# Patient Record
Sex: Female | Born: 1960 | Race: White | Hispanic: No | Smoking: Never smoker
Health system: Southern US, Community
[De-identification: ages and names within clinical notes are randomized; demographics above are authoritative.]

## PROBLEM LIST (undated history)

## (undated) DIAGNOSIS — F32A Depression, unspecified: Secondary | ICD-10-CM

## (undated) DIAGNOSIS — I10 Essential (primary) hypertension: Secondary | ICD-10-CM

## (undated) DIAGNOSIS — F419 Anxiety disorder, unspecified: Secondary | ICD-10-CM

## (undated) DIAGNOSIS — E119 Type 2 diabetes mellitus without complications: Secondary | ICD-10-CM

## (undated) DIAGNOSIS — K589 Irritable bowel syndrome without diarrhea: Secondary | ICD-10-CM

## (undated) DIAGNOSIS — F329 Major depressive disorder, single episode, unspecified: Secondary | ICD-10-CM

## (undated) DIAGNOSIS — E785 Hyperlipidemia, unspecified: Secondary | ICD-10-CM

## (undated) HISTORY — DX: Type 2 diabetes mellitus without complications: E11.9

## (undated) HISTORY — DX: Hyperlipidemia, unspecified: E78.5

## (undated) HISTORY — DX: Essential (primary) hypertension: I10

## (undated) HISTORY — DX: Depression, unspecified: F32.A

## (undated) HISTORY — DX: Anxiety disorder, unspecified: F41.9

## (undated) HISTORY — DX: Major depressive disorder, single episode, unspecified: F32.9

---

## 2000-12-31 ENCOUNTER — Other Ambulatory Visit: Admission: RE | Admit: 2000-12-31 | Discharge: 2000-12-31 | Payer: Self-pay | Admitting: Obstetrics and Gynecology

## 2006-06-03 ENCOUNTER — Emergency Department (HOSPITAL_COMMUNITY): Admission: EM | Admit: 2006-06-03 | Discharge: 2006-06-04 | Payer: Self-pay | Admitting: Emergency Medicine

## 2009-07-08 HISTORY — PX: TUBAL LIGATION: SHX77

## 2009-07-08 HISTORY — PX: ENDOMETRIAL ABLATION: SHX621

## 2010-03-30 ENCOUNTER — Emergency Department (HOSPITAL_COMMUNITY): Admission: EM | Admit: 2010-03-30 | Discharge: 2010-03-30 | Payer: Self-pay | Admitting: Emergency Medicine

## 2010-04-27 ENCOUNTER — Ambulatory Visit (HOSPITAL_COMMUNITY): Admission: RE | Admit: 2010-04-27 | Discharge: 2010-04-27 | Payer: Self-pay | Admitting: Obstetrics and Gynecology

## 2010-05-01 ENCOUNTER — Ambulatory Visit (HOSPITAL_COMMUNITY): Admission: RE | Admit: 2010-05-01 | Discharge: 2010-05-01 | Payer: Self-pay | Admitting: Obstetrics and Gynecology

## 2010-05-07 ENCOUNTER — Ambulatory Visit (HOSPITAL_COMMUNITY): Admission: RE | Admit: 2010-05-07 | Discharge: 2010-05-07 | Payer: Self-pay | Admitting: Obstetrics and Gynecology

## 2010-09-19 LAB — CBC
HCT: 33.2 % — ABNORMAL LOW (ref 36.0–46.0)
Hemoglobin: 11.4 g/dL — ABNORMAL LOW (ref 12.0–15.0)
MCH: 30.2 pg (ref 26.0–34.0)
MCHC: 34.3 g/dL (ref 30.0–36.0)
MCV: 88.2 fL (ref 78.0–100.0)
Platelets: 249 10*3/uL (ref 150–400)
RBC: 3.76 MIL/uL — ABNORMAL LOW (ref 3.87–5.11)
RDW: 13 % (ref 11.5–15.5)
WBC: 8 10*3/uL (ref 4.0–10.5)

## 2010-09-19 LAB — BASIC METABOLIC PANEL
BUN: 11 mg/dL (ref 6–23)
CO2: 24 mEq/L (ref 19–32)
Calcium: 8.7 mg/dL (ref 8.4–10.5)
Chloride: 103 mEq/L (ref 96–112)
Creatinine, Ser: 0.77 mg/dL (ref 0.4–1.2)
GFR calc Af Amer: >60 mL/min (ref >60)
GFR calc non Af Amer: >60 mL/min (ref >60)
Glucose, Bld: 83 mg/dL (ref 70–99)
Potassium: 3.8 mEq/L (ref 3.5–5.1)
Sodium: 135 mEq/L (ref 135–145)

## 2010-09-19 LAB — SURGICAL PCR SCREEN
MRSA, PCR: NEGATIVE
Staphylococcus aureus: POSITIVE — AB

## 2010-09-19 LAB — HCG, QUANTITATIVE, PREGNANCY: hCG, Beta Chain, Quant, S: <2 m[IU]/mL (ref <5)

## 2010-09-20 LAB — POCT CARDIAC MARKERS
CKMB, poc: 1.1 ng/mL (ref 1.0–8.0)
CKMB, poc: 1.5 ng/mL (ref 1.0–8.0)
Myoglobin, poc: 113 ng/mL (ref 12–200)
Myoglobin, poc: 88.2 ng/mL (ref 12–200)
Troponin i, poc: <0.05 ng/mL (ref 0.00–0.09)
Troponin i, poc: <0.05 ng/mL (ref 0.00–0.09)

## 2010-09-20 LAB — CBC
HCT: 33.4 % — ABNORMAL LOW (ref 36.0–46.0)
Hemoglobin: 11.6 g/dL — ABNORMAL LOW (ref 12.0–15.0)
MCH: 30.7 pg (ref 26.0–34.0)
MCHC: 34.7 g/dL (ref 30.0–36.0)
MCV: 88.4 fL (ref 78.0–100.0)
Platelets: 203 10*3/uL (ref 150–400)
RBC: 3.78 MIL/uL — ABNORMAL LOW (ref 3.87–5.11)
RDW: 12.5 % (ref 11.5–15.5)
WBC: 7.8 10*3/uL (ref 4.0–10.5)

## 2010-09-20 LAB — DIFFERENTIAL
Basophils Absolute: 0.1 10*3/uL (ref 0.0–0.1)
Basophils Relative: 1 % (ref 0–1)
Eosinophils Absolute: 0.1 10*3/uL (ref 0.0–0.7)
Eosinophils Relative: 1 % (ref 0–5)
Lymphocytes Relative: 21 % (ref 12–46)
Lymphs Abs: 1.6 10*3/uL (ref 0.7–4.0)
Monocytes Absolute: 0.4 10*3/uL (ref 0.1–1.0)
Monocytes Relative: 5 % (ref 3–12)
Neutro Abs: 5.6 10*3/uL (ref 1.7–7.7)
Neutrophils Relative %: 72 % (ref 43–77)

## 2010-09-20 LAB — BASIC METABOLIC PANEL
BUN: 11 mg/dL (ref 6–23)
CO2: 24 mEq/L (ref 19–32)
Calcium: 8.9 mg/dL (ref 8.4–10.5)
Chloride: 103 mEq/L (ref 96–112)
Creatinine, Ser: 0.84 mg/dL (ref 0.4–1.2)
GFR calc Af Amer: >60 mL/min (ref >60)
GFR calc non Af Amer: >60 mL/min (ref >60)
Glucose, Bld: 97 mg/dL (ref 70–99)
Potassium: 3 mEq/L — ABNORMAL LOW (ref 3.5–5.1)
Sodium: 136 mEq/L (ref 135–145)

## 2010-09-20 LAB — D-DIMER, QUANTITATIVE: D-Dimer, Quant: 0.38 ug/mL-FEU (ref 0.00–0.48)

## 2011-06-07 ENCOUNTER — Other Ambulatory Visit: Payer: Self-pay | Admitting: Obstetrics and Gynecology

## 2011-06-07 DIAGNOSIS — Z139 Encounter for screening, unspecified: Secondary | ICD-10-CM

## 2011-10-09 ENCOUNTER — Other Ambulatory Visit: Payer: Self-pay | Admitting: Obstetrics and Gynecology

## 2011-10-09 DIAGNOSIS — Z139 Encounter for screening, unspecified: Secondary | ICD-10-CM

## 2011-10-14 ENCOUNTER — Ambulatory Visit (HOSPITAL_COMMUNITY)
Admission: RE | Admit: 2011-10-14 | Discharge: 2011-10-14 | Disposition: A | Discharge status: Home / Self Care | Payer: 59 | Source: Ambulatory Visit | Attending: Obstetrics and Gynecology | Admitting: Obstetrics and Gynecology

## 2011-10-14 DIAGNOSIS — Z1231 Encounter for screening mammogram for malignant neoplasm of breast: Secondary | ICD-10-CM | POA: Insufficient documentation

## 2011-10-14 DIAGNOSIS — Z139 Encounter for screening, unspecified: Secondary | ICD-10-CM

## 2012-10-19 ENCOUNTER — Other Ambulatory Visit: Payer: Self-pay | Admitting: Obstetrics and Gynecology

## 2012-10-19 DIAGNOSIS — Z139 Encounter for screening, unspecified: Secondary | ICD-10-CM

## 2012-10-26 ENCOUNTER — Ambulatory Visit (HOSPITAL_COMMUNITY)
Admission: RE | Admit: 2012-10-26 | Discharge: 2012-10-26 | Disposition: A | Discharge status: Home / Self Care | Payer: 59 | Source: Ambulatory Visit | Attending: Obstetrics and Gynecology | Admitting: Obstetrics and Gynecology

## 2012-10-26 DIAGNOSIS — Z139 Encounter for screening, unspecified: Secondary | ICD-10-CM

## 2012-10-26 DIAGNOSIS — Z1231 Encounter for screening mammogram for malignant neoplasm of breast: Secondary | ICD-10-CM | POA: Insufficient documentation

## 2013-01-11 ENCOUNTER — Ambulatory Visit (INDEPENDENT_AMBULATORY_CARE_PROVIDER_SITE_OTHER): Payer: 59 | Admitting: Gastroenterology

## 2013-01-11 ENCOUNTER — Encounter: Payer: Self-pay | Admitting: Gastroenterology

## 2013-01-11 VITALS — BP 125/77 | HR 63 | Temp 97.4°F | Ht 61.0 in | Wt 161.8 lb

## 2013-01-11 DIAGNOSIS — Z1211 Encounter for screening for malignant neoplasm of colon: Secondary | ICD-10-CM

## 2013-01-11 MED ORDER — PEG 3350-KCL-NA BICARB-NACL 420 G PO SOLR: 4000.0000 mL | ORAL | Status: DC

## 2013-01-11 NOTE — Progress Notes (Signed)
Referring Provider: No ref. provider found Primary Care Physician:  Alleen Borne Primary Gastroenterologist:  Dr. Darrick Penna   Chief Complaint  Patient presents with  . Colonoscopy    HPI:   52 year old female presents today for initial screening colonoscopy. Notes chronic history of loose stools intermittently. Some days without any bowel movements, others with loose stools 2-3 times per day. Usually postprandial. Occasional abdominal cramping. No rectal bleeding. Weight is stable. [employer], hot dogs exacerbate symptoms. Will take Imodium to help. Rare reflux.   Past Medical History  Diagnosis Date  . Hypertension   . Hypercholesterolemia   . Anxiety     Past Surgical History  Procedure Laterality Date  . Partial hysterectomy    . Tubal ligation      Current Outpatient Prescriptions  Medication Sig Dispense Refill  . aspirin 81 MG tablet Take 81 mg by mouth daily.      . cetirizine (ZYRTEC) 10 MG tablet Take 10 mg by mouth daily.      Marland Kitchen PARoxetine (PAXIL) 20 MG tablet Take 20 mg by mouth every morning.      . pravastatin (PRAVACHOL) 20 MG tablet Take 20 mg by mouth daily.      Marland Kitchen triamterene-hydrochlorothiazide (DYAZIDE) 37.5-25 MG per capsule Take 1 capsule by mouth every morning.      . polyethylene glycol-electrolytes (TRILYTE) 420 G solution Take 4,000 mLs by mouth as directed.  4000 mL  0   No current facility-administered medications for this visit.    Allergies as of 01/11/2013 - Review Complete 01/11/2013  Allergen Reaction Noted  . Prednisone Swelling 01/11/2013    Family History  Problem Relation Age of Onset  . Colon cancer Neg Hx     History   Social History  . Marital Status: Divorced    Spouse Name: N/A    Number of Children: N/A  . Years of Education: N/A   Occupational History  . Employed     EDI    Social History Main Topics  . Smoking status: Never Smoker   . Smokeless tobacco: Not on file  . Alcohol Use: Yes     Comment:  socially  . Drug Use: No  . Sexually Active: Not on file   Other Topics Concern  . Not on file   Social History Narrative  . No narrative on file    Review of Systems: Gen: see HPI CV: Denies chest pain, heart palpitations, syncope, peripheral edema. Resp: +DOE GI: see HPI GU : Denies urinary burning, urinary frequency, urinary incontinence.  MS: age-related joint pain Derm: Denies rash, itching, dry skin Psych: +anxiety Heme: Denies bruising, bleeding, and enlarged lymph nodes.  Physical Exam: BP 125/77  Pulse 63  Temp(Src) 97.4 F (36.3 C) (Oral)  Ht 5\' 1"  (1.549 m)  Wt 161 lb 12.8 oz (73.392 kg)  BMI 30.59 kg/m2 General:   Alert and oriented. Well-developed, well-nourished, pleasant and cooperative. Head:  Normocephalic and atraumatic. Eyes:  Conjunctiva pink, sclera clear, no icterus.   Conjunctiva pink. Ears:  Normal auditory acuity. Nose:  No deformity, discharge,  or lesions. Mouth:  No deformity or lesions, mucosa pink and moist.  Neck:  Supple, without mass or thyromegaly. Lungs:  Clear to auscultation bilaterally, without wheezing, rales, or rhonchi.  Heart:  S1, S2 present without murmurs noted.  Abdomen:  +BS, soft, non-tender and non-distended. Without mass or HSM. No rebound or guarding. No hernias noted. Rectal:  Deferred  Msk:  Symmetrical without gross deformities. Normal posture.  Extremities:  Without clubbing or edema. Neurologic:  Alert and  oriented x4;  grossly normal neurologically. Skin:  Intact, warm and dry without significant lesions or rashes Cervical Nodes:  No significant cervical adenopathy. Psych:  Alert and cooperative. Normal mood and affect.

## 2013-01-11 NOTE — Patient Instructions (Addendum)
Please review the handout on IBS.  Start taking supplemental fiber daily. Examples include Benefiber or Metamucil.  Avoid fatty, greasy foods. Try to limit dairy products.   Start taking a probiotic each day. Examples include Digestive Advantage, Phillip's Colon Health, [employer] generic brand, Align, Restora.  We have scheduled a colonoscopy in the near future with Dr. Darrick Penna.   Diet and Irritable Bowel Syndrome  No cure has been found for irritable bowel syndrome (IBS). Many options are available to treat the symptoms. Your caregiver will give you the best treatments available for your symptoms. He or she will also encourage you to manage stress and to make changes to your diet. You need to work with your caregiver and Registered Dietician to find the best combination of medicine, diet, counseling, and support to control your symptoms. The following are some diet suggestions. FOODS THAT MAKE IBS WORSE  Fatty foods, such as [place] fries.  Milk products, such as cheese or ice cream.  Chocolate.  Alcohol.  Caffeine (found in coffee and some sodas).  Carbonated drinks, such as soda. If certain foods cause symptoms, you should eat less of them or stop eating them. FOOD JOURNAL   Keep a journal of the foods that seem to cause distress. Write down:  What you are eating during the day and when.  What problems you are having after eating.  When the symptoms occur in relation to your meals.  What foods always make you feel badly.  Take your notes with you to your caregiver to see if you should stop eating certain foods. FOODS THAT MAKE IBS BETTER Fiber reduces IBS symptoms, especially constipation, because it makes stools soft, bulky, and easier to pass. Fiber is found in bran, bread, cereal, beans, fruit, and vegetables. Examples of foods with fiber include:  Apples.  Peaches.  Pears.  Berries.  Figs.  Broccoli, raw.  Cabbage.  Carrots.  Raw peas.  Kidney  beans.  Lima beans.  Whole-grain bread.  Whole-grain cereal. Add foods with fiber to your diet a little at a time. This will let your body get used to them. Too much fiber at once might cause gas and swelling of your abdomen. This can trigger symptoms in a person with IBS. Caregivers usually recommend a diet with enough fiber to produce soft, painless bowel movements. High fiber diets may cause gas and bloating. However, these symptoms often go away within a few weeks, as your body adjusts. In many cases, dietary fiber may lessen IBS symptoms, particularly constipation. However, it may not help pain or diarrhea. High fiber diets keep the colon mildly enlarged (distended) with the added fiber. This may help prevent spasms in the colon. Some forms of fiber also keep water in the stool, thereby preventing hard stools that are difficult to pass.  Besides telling you to eat more foods with fiber, your caregiver may also tell you to get more fiber by taking a fiber pill or drinking water mixed with a special high fiber powder. An example of this is a natural fiber laxative containing psyllium seed.  TIPS  Large meals can cause cramping and diarrhea in people with IBS. If this happens to you, try eating 4 or 5 small meals a day, or try eating less at each of your usual 3 meals. It may also help if your meals are low in fat and high in carbohydrates. Examples of carbohydrates are pasta, rice, whole-grain breads and cereals, fruits, and vegetables.  If dairy products cause your  symptoms to flare up, you can try eating less of those foods. You might be able to handle yogurt better than other dairy products, because it contains bacteria that helps with digestion. Dairy products are an important source of calcium and other nutrients. If you need to avoid dairy products, be sure to talk with a Registered Dietitian about getting these nutrients through other food sources.  Drink enough water and fluids to keep  your urine clear or pale yellow. This is important, especially if you have diarrhea. FOR MORE INFORMATION  International Foundation for Functional Gastrointestinal Disorders: www.iffgd.org  National Digestive Diseases Information Clearinghouse: digestive.StageSync.si Document Released: 09/14/2003 Document Revised: 09/16/2011 Document Reviewed: 06/01/2007 [facility] Patient Information 2014 [place], [place].

## 2013-01-13 DIAGNOSIS — Z1211 Encounter for screening for malignant neoplasm of colon: Secondary | ICD-10-CM | POA: Insufficient documentation

## 2013-01-13 NOTE — Progress Notes (Signed)
CC PCP 

## 2013-01-13 NOTE — Assessment & Plan Note (Signed)
52 year old female with need for initial screening colonoscopy, presenting with IBS symptoms, mixed pattern. No rectal bleeding noted. She does report exacerbation of symptoms with certain dietary intake, and I have discussed avoidance of these foods. For now, implement supplemental fiber, add a probiotic, and avoid trigger foods. Proceed with lower GI evaluation in the near future.  Proceed with colonoscopy with Dr. Darrick Penna in the near future. The risks, benefits, and alternatives have been discussed in detail with the patient. They state understanding and desire to proceed.

## 2013-01-18 ENCOUNTER — Encounter (HOSPITAL_COMMUNITY): Payer: Self-pay | Admitting: *Deleted

## 2013-01-18 ENCOUNTER — Encounter (HOSPITAL_COMMUNITY)
Admission: RE | Disposition: A | Discharge status: Home / Self Care | Payer: Self-pay | Source: Ambulatory Visit | Attending: Gastroenterology

## 2013-01-18 ENCOUNTER — Ambulatory Visit (HOSPITAL_COMMUNITY)
Admission: RE | Admit: 2013-01-18 | Discharge: 2013-01-18 | Disposition: A | Discharge status: Home / Self Care | Payer: 59 | Source: Ambulatory Visit | Attending: Gastroenterology | Admitting: Gastroenterology

## 2013-01-18 DIAGNOSIS — Z1211 Encounter for screening for malignant neoplasm of colon: Secondary | ICD-10-CM

## 2013-01-18 DIAGNOSIS — K648 Other hemorrhoids: Secondary | ICD-10-CM | POA: Insufficient documentation

## 2013-01-18 DIAGNOSIS — R197 Diarrhea, unspecified: Secondary | ICD-10-CM

## 2013-01-18 DIAGNOSIS — I10 Essential (primary) hypertension: Secondary | ICD-10-CM | POA: Insufficient documentation

## 2013-01-18 HISTORY — PX: COLONOSCOPY: SHX5424

## 2013-01-18 HISTORY — DX: Irritable bowel syndrome, unspecified: K58.9

## 2013-01-18 SURGERY — COLONOSCOPY
Anesthesia: Moderate Sedation

## 2013-01-18 MED ORDER — MIDAZOLAM HCL 5 MG/5ML IJ SOLN
INTRAMUSCULAR | Status: DC | PRN
  Administered 2013-01-18: 1 mg via INTRAVENOUS
  Administered 2013-01-18 (×2): 2 mg via INTRAVENOUS

## 2013-01-18 MED ORDER — MIDAZOLAM HCL 5 MG/5ML IJ SOLN
INTRAMUSCULAR | Status: AC
  Filled 2013-01-18: qty 10

## 2013-01-18 MED ORDER — MEPERIDINE HCL 100 MG/ML IJ SOLN
INTRAMUSCULAR | Status: AC
  Filled 2013-01-18: qty 2

## 2013-01-18 MED ORDER — MEPERIDINE HCL 100 MG/ML IJ SOLN
INTRAMUSCULAR | Status: DC | PRN
  Administered 2013-01-18: 25 mg via INTRAVENOUS
  Administered 2013-01-18: 50 mg via INTRAVENOUS

## 2013-01-18 MED ORDER — STERILE WATER FOR IRRIGATION IR SOLN
Status: DC | PRN
  Administered 2013-01-18: 13:00:00

## 2013-01-18 MED ORDER — SODIUM CHLORIDE 0.9 % IV SOLN
INTRAVENOUS | Status: DC
  Administered 2013-01-18: 13:00:00 via INTRAVENOUS

## 2013-01-18 NOTE — Op Note (Addendum)
[facility] [address] [place], [address]   COLONOSCOPY PROCEDURE REPORT  PATIENT: [name] [name]  MR#: [id number] BIRTHDATE: 17-Dec-1960 , 51  yrs. old GENDER: Female ENDOSCOPIST: [name] Eva, MD REFERRED XB:JYNW Claggett, PA-C PROCEDURE DATE:  01/18/2013 PROCEDURE:   Colonoscopy with biopsy INDICATIONS:Colorectal cancer screening.  DIARRHEA 2-3X/WEEK FOR THE PAST 20 YEARS. NO RECTAL BLEEDING. MEDICATIONS: Demerol 75 mg IV and Versed 5 mg IV  DESCRIPTION OF PROCEDURE:    Physical exam was performed.  Informed consent was obtained from the patient after explaining the benefits, risks, and alternatives to procedure.  The patient was connected to monitor and placed in left lateral position. Continuous oxygen was provided by nasal cannula and IV medicine administered through an indwelling cannula.  After administration of sedation and rectal exam, the patients rectum was intubated and the EC-3890Li ([id number])  colonoscope was advanced under direct visualization to the ileum.  The scope was removed slowly by carefully examining the color, texture, anatomy, and integrity mucosa on the way out.  The patient was recovered in endoscopy and discharged home in satisfactory condition.    COLON FINDINGS: The mucosa appeared normal in the terminal ileum.  , A normal appearing cecum, ileocecal valve, and appendiceal orifice were identified.  The ascending, hepatic flexure, transverse, splenic flexure, descending, sigmoid colon and rectum appeared unremarkable. RANDOM BIOPSIES OBTAINED TO EVALUIATE FOR MICROSCOPIC COLITIS. No polyps or cancers were seen.  , and Small internal hemorrhoids were found.  PREP QUALITY: good.   CECAL W/D TIME: 14 minutes  COMPLICATIONS: None  ENDOSCOPIC IMPRESSION: 1.   Normal mucosa in the terminal ileum 2.   Normal colon 3.  Small internal hemorrhoids 4.   INTERMITTENT DIARRHEA MOST LIKELY DUE TO IBS-MIXED PATTERN, LESS LIKELY CELIAC  SPRUE OR MICROSCOPIC COLITIS  RECOMMENDATIONS: AWAIT BIOPSY HIGH FIBER/LOW FAT DIET CONTINUE PROBIOTIC DAILY IMODIUM AS NEEDED FOR DIARRHEA OPV in 4 mos. CONSIDER CELIAC SEROLOGIES AND/OR HBT FOR SIBO. TCS IN 10 YEARS       _______________________________ eSignedJonette Eva, MD 01/18/2013 2:35 PM Revised: 01/18/2013 2:35 PM    PATIENT NAME:  [name], [name] MR#: [id number]

## 2013-01-18 NOTE — H&P (Signed)
  Primary Care Physician:  Alleen Borne Primary Gastroenterologist:  Dr. Darrick Penna  Pre-Procedure History & Physical: HPI:  [name] [name] is a 52 y.o. female here for COLON CANCER SCREENING.  Past Medical History  Diagnosis Date  . Hypertension   . Hypercholesterolemia   . Anxiety   . IBS (irritable bowel syndrome)     Past Surgical History  Procedure Laterality Date  . Partial hysterectomy  2011    [facility]  . Tubal ligation  2011    [name]    Prior to Admission medications   Medication Sig Start Date End Date Taking? Authorizing Provider  aspirin 81 MG tablet Take 81 mg by mouth daily.   Yes Historical Provider, MD  cetirizine (ZYRTEC) 10 MG tablet Take 10 mg by mouth daily.   Yes Historical Provider, MD  PARoxetine (PAXIL) 20 MG tablet Take 20 mg by mouth every morning.   Yes Historical Provider, MD  polyethylene glycol-electrolytes (TRILYTE) 420 G solution Take 4,000 mLs by mouth as directed. 01/11/13  Yes West Bali, MD  pravastatin (PRAVACHOL) 20 MG tablet Take 20 mg by mouth daily.   Yes Historical Provider, MD  Probiotic Product (RESTORA PO) Take 1 tablet by mouth daily.   Yes Historical Provider, MD  triamterene-hydrochlorothiazide (DYAZIDE) 37.5-25 MG per capsule Take 1 capsule by mouth every morning.   Yes Historical Provider, MD    Allergies as of 01/11/2013 - Review Complete 01/11/2013  Allergen Reaction Noted  . Prednisone Swelling 01/11/2013    Family History  Problem Relation Age of Onset  . Colon cancer Neg Hx     History   Social History  . Marital Status: Divorced    Spouse Name: N/A    Number of Children: N/A  . Years of Education: N/A   Occupational History  . Employed     EDI    Social History Main Topics  . Smoking status: Never Smoker   . Smokeless tobacco: Not on file  . Alcohol Use: Yes     Comment: socially  . Drug Use: No  . Sexually Active: Not on file   Other Topics Concern  . Not on file   Social History  Narrative  . No narrative on file    Review of Systems: See HPI, otherwise negative ROS   Physical Exam: BP 137/84  Temp(Src) 97.7 F (36.5 C) (Oral)  Resp 22  Ht 5\' 1"  (1.549 m)  Wt 161 lb (73.029 kg)  BMI 30.44 kg/m2  SpO2 95% General:   Alert,  pleasant and cooperative in NAD Head:  Normocephalic and atraumatic. Neck:  Supple; Lungs:  Clear throughout to auscultation.    Heart:  Regular rate and rhythm. Abdomen:  Soft, nontender and nondistended. Normal bowel sounds, without guarding, and without rebound.   Neurologic:  Alert and  oriented x4;  grossly normal neurologically.  Impression/Plan:    SCREENING  Plan:  1. TCS TODAY

## 2013-01-20 ENCOUNTER — Telehealth: Payer: Self-pay | Admitting: Gastroenterology

## 2013-01-20 ENCOUNTER — Encounter: Payer: Self-pay | Admitting: Gastroenterology

## 2013-01-20 DIAGNOSIS — K589 Irritable bowel syndrome without diarrhea: Secondary | ICD-10-CM | POA: Insufficient documentation

## 2013-01-20 DIAGNOSIS — R197 Diarrhea, unspecified: Secondary | ICD-10-CM

## 2013-01-20 NOTE — Assessment & Plan Note (Signed)
NEEDS TTG IgA. CONSIDER HBT FOR SIBO.

## 2013-01-20 NOTE — Telephone Encounter (Addendum)
Please call pt. Her colon biopsies are normal. HER DIARRHEA IS MOST LIKELY DUE TO IBS-D. SHE SHOULD BE CHECKED FOR CELIAC SPRUE.    Use Imodium as needed for diarrhea.  FOLLOW A HIGH FIBER/low fat DIET. AVOID ITEMS THAT CAUSE BLOATING.   TAKE A PROBIOTIC DAILY FOR ONE MONTH ([employer] BRAND, PHILLIP'S COLON HEALTH, OR RESTORA). STOP IT AFTER 1 MONTH IF YOU FEEL IT'S NOT MAKING A DIFFERENCE.  FOLLOW UP IN 4 MOS E30 SLF.    Next colonoscopy in 10 years.

## 2013-01-21 ENCOUNTER — Encounter (HOSPITAL_COMMUNITY): Payer: Self-pay | Admitting: Gastroenterology

## 2013-01-21 NOTE — Telephone Encounter (Signed)
CC PCP 

## 2013-01-21 NOTE — Telephone Encounter (Signed)
Lab order was faxed to [facility] and pt said she will go today or tomorrow.

## 2013-01-21 NOTE — Telephone Encounter (Signed)
Pt returned call and was informed of her results.  

## 2013-01-21 NOTE — Telephone Encounter (Signed)
REMINDER APPTS MADE FOR TCS/OV

## 2013-01-21 NOTE — Telephone Encounter (Signed)
LM for pt to call

## 2013-01-26 NOTE — Telephone Encounter (Signed)
Cc PCP 

## 2013-01-26 NOTE — Telephone Encounter (Addendum)
PLEASE CALL PT. HER BLOOD TEST FOR CELIAC SPRUE IS NEGATIVE.    Use Imodium as needed for diarrhea. FOLLOW A HIGH FIBER/low fat DIET. AVOID ITEMS THAT CAUSE BLOATING.  TAKE A PROBIOTIC DAILY.   FOLLOW UP IN 4 MOS E30 SLF.  IF HER DIARRHEA IS NOT IMRPOVED AFTER A CHANGE IN DIET AND ADDING A PROBIOTIC, SHE WILL NEED A HBT FOR SIBO.

## 2013-01-27 ENCOUNTER — Telehealth: Payer: Self-pay | Admitting: Gastroenterology

## 2013-01-27 NOTE — Telephone Encounter (Signed)
Called and informed pt.  

## 2013-01-27 NOTE — Telephone Encounter (Signed)
LM for pt to call

## 2013-01-27 NOTE — Telephone Encounter (Signed)
Pt was returning call to nurse, but nurse was not available at the moment. Patient asked for a return call to her work number. [phone]  (regarding labs)

## 2013-01-27 NOTE — Telephone Encounter (Signed)
See results note. Pt is aware.  

## 2013-05-19 ENCOUNTER — Encounter: Payer: Self-pay | Admitting: Gastroenterology

## 2013-05-21 ENCOUNTER — Emergency Department (HOSPITAL_COMMUNITY)
Admission: EM | Admit: 2013-05-21 | Discharge: 2013-05-21 | Disposition: A | Discharge status: Home / Self Care | Payer: 59 | Attending: Emergency Medicine | Admitting: Emergency Medicine

## 2013-05-21 ENCOUNTER — Encounter (HOSPITAL_COMMUNITY): Payer: Self-pay | Admitting: Emergency Medicine

## 2013-05-21 DIAGNOSIS — H9209 Otalgia, unspecified ear: Secondary | ICD-10-CM | POA: Insufficient documentation

## 2013-05-21 DIAGNOSIS — L089 Local infection of the skin and subcutaneous tissue, unspecified: Secondary | ICD-10-CM

## 2013-05-21 DIAGNOSIS — I10 Essential (primary) hypertension: Secondary | ICD-10-CM | POA: Insufficient documentation

## 2013-05-21 DIAGNOSIS — E7800 Pure hypercholesterolemia, unspecified: Secondary | ICD-10-CM | POA: Insufficient documentation

## 2013-05-21 DIAGNOSIS — Z8719 Personal history of other diseases of the digestive system: Secondary | ICD-10-CM | POA: Insufficient documentation

## 2013-05-21 DIAGNOSIS — F411 Generalized anxiety disorder: Secondary | ICD-10-CM | POA: Insufficient documentation

## 2013-05-21 DIAGNOSIS — R450 Nervousness: Secondary | ICD-10-CM | POA: Insufficient documentation

## 2013-05-21 DIAGNOSIS — H9201 Otalgia, right ear: Secondary | ICD-10-CM

## 2013-05-21 DIAGNOSIS — Z79899 Other long term (current) drug therapy: Secondary | ICD-10-CM | POA: Insufficient documentation

## 2013-05-21 DIAGNOSIS — Z7982 Long term (current) use of aspirin: Secondary | ICD-10-CM | POA: Insufficient documentation

## 2013-05-21 MED ORDER — DOXYCYCLINE HYCLATE 100 MG PO CAPS: 100.0000 mg | ORAL_CAPSULE | Freq: Two times a day (BID) | ORAL | Status: AC

## 2013-05-21 NOTE — ED Notes (Signed)
Pain lt upper thigh for 1 month and pain rt ear.

## 2013-05-21 NOTE — ED Provider Notes (Signed)
CSN: [id number]     Arrival date & time 05/21/13  1742 History   First MD Initiated Contact with Patient 05/21/13 1843     Chief Complaint  Patient presents with  . Leg Pain   (Consider location/radiation/quality/duration/timing/severity/associated sxs/prior Treatment) HPI Comments: Patient is a 52 year old female who presents to the emergency with A." bump" on her left upper thigh. She also complains of pain of the right year. The patient states that the bump at the thigh area has been there for nearly a month. ACT is getting irritated and swelling and then going down and coming back. She's not had any drainage from it. No red noted from and time. She has tried various over-the-counter cream but these are not helping.  The patient also complains of pain of her right ear that started approximately a week ago. She's not had any injury to the ear. She's had some sinus tightness and drainage, but no drainage from the ear. There's been no reported high fever. The patient has not taken anything for the ear problem.  Patient is a 52 y.o. female presenting with leg pain. The history is provided by the patient.  Leg Pain Associated symptoms: no back pain and no neck pain     Past Medical History  Diagnosis Date  . Hypertension   . Hypercholesterolemia   . Anxiety   . IBS (irritable bowel syndrome)   . Diarrhea 01/20/2013   Past Surgical History  Procedure Laterality Date  . Partial hysterectomy  2011    [facility]  . Tubal ligation  2011    [facility]  . Colonoscopy N/A 01/18/2013    Procedure: COLONOSCOPY;  Surgeon: West Bali, MD;  Location: [facility];  Service: Endoscopy;  Laterality: N/A;  2:00-moved to 13:20pm Left message on her cell phone to come in @ 12:20pm/K.F.  . Abdominal hysterectomy     Family History  Problem Relation Age of Onset  . Colon cancer Neg Hx    History  Substance Use Topics  . Smoking status: Never Smoker   . Smokeless tobacco: Not on file  .  Alcohol Use: Yes     Comment: socially   OB History   Grav Para Term Preterm Abortions TAB SAB Ect Mult Living                 Review of Systems  Constitutional: Negative for activity change.       All ROS Neg except as noted in HPI  HENT: Negative for nosebleeds.   Eyes: Negative for photophobia and discharge.  Respiratory: Negative for cough, shortness of breath and wheezing.   Cardiovascular: Negative for chest pain and palpitations.  Gastrointestinal: Negative for abdominal pain and blood in stool.  Genitourinary: Negative for dysuria, frequency and hematuria.  Musculoskeletal: Negative for arthralgias, back pain and neck pain.  Skin: Negative.   Neurological: Negative for dizziness, seizures and speech difficulty.  Hematological: Does not bruise/bleed easily.  Psychiatric/Behavioral: Negative for hallucinations and confusion. The patient is nervous/anxious.     Allergies  Prednisone and Tessalon  Home Medications   Current Outpatient Rx  Name  Route  Sig  Dispense  Refill  . aspirin 81 MG tablet   Oral   Take 81 mg by mouth daily.         . cetirizine (ZYRTEC) 10 MG tablet   Oral   Take 10 mg by mouth daily.         Marland Kitchen PARoxetine (PAXIL) 20 MG  tablet   Oral   Take 20 mg by mouth every morning.         . pravastatin (PRAVACHOL) 20 MG tablet   Oral   Take 20 mg by mouth daily.         . Probiotic Product (RESTORA PO)   Oral   Take 1 tablet by mouth daily.         Marland Kitchen triamterene-hydrochlorothiazide (DYAZIDE) 37.5-25 MG per capsule   Oral   Take 1 capsule by mouth every morning.          BP 135/45  Pulse 88  Temp(Src) 97.8 F (36.6 C) (Oral)  Resp 18  Ht 5' (1.524 m)  Wt 162 lb 5 oz (73.624 kg)  BMI 31.70 kg/m2  SpO2 100% Physical Exam  Nursing note and vitals reviewed. Constitutional: She is oriented to person, place, and time. She appears well-developed and well-nourished.  Non-toxic appearance.  HENT:  Head: Normocephalic.  Right  Ear: Hearing, tympanic membrane, external ear and ear canal normal. No foreign bodies. Tympanic membrane is not injected and not erythematous.  Left Ear: Hearing, tympanic membrane, external ear and ear canal normal. No foreign bodies. Tympanic membrane is not injected and not erythematous.  Eyes: EOM and lids are normal. Pupils are equal, round, and reactive to light.  Neck: Normal range of motion. Neck supple. Carotid bruit is not present.  Cardiovascular: Normal rate, regular rhythm, normal heart sounds, intact distal pulses and normal pulses.   Pulmonary/Chest: Breath sounds normal. No respiratory distress.  Abdominal: Soft. Bowel sounds are normal. There is no tenderness. There is no guarding.  Musculoskeletal: Normal range of motion.  There is a quarter size area of tenderness, the area is mildly raised. No red streaking noted. No drainage.  Lymphadenopathy:       Head (right side): No submandibular adenopathy present.       Head (left side): No submandibular adenopathy present.    She has no cervical adenopathy.  Neurological: She is alert and oriented to person, place, and time. She has normal strength. No cranial nerve deficit or sensory deficit.  Skin: Skin is warm and dry.  Psychiatric: She has a normal mood and affect. Her speech is normal.    ED Course  Procedures (including critical care time) Labs Review Labs Reviewed - No data to display Imaging Review No results found.  EKG Interpretation   None       MDM  No diagnosis found. *I have reviewed nursing notes, vital signs, and all appropriate lab and imaging results for this patient.** Pt has a developing abscess of the inner thigh on the left. Pt advised to use warm tub soaks. Rx for doxycycline given. Pt to see her MD or return to the ED if any changes or problem.   Kathie Dike, PA-C 05/23/13 1122

## 2013-05-23 NOTE — ED Provider Notes (Signed)
Medical screening examination/treatment/procedure(s) were performed by non-physician practitioner and as supervising physician I was immediately available for consultation/collaboration.  EKG Interpretation   None        Donnetta Hutching, MD 05/23/13 [phone]

## 2013-09-21 NOTE — Progress Notes (Signed)
REVIEWED. TCS JUL 2014 NL ILEUM, NL COLON BX Results JUL 2014 TTG IgA NL

## 2013-09-30 ENCOUNTER — Encounter (INDEPENDENT_AMBULATORY_CARE_PROVIDER_SITE_OTHER): Payer: Self-pay

## 2013-09-30 ENCOUNTER — Encounter: Payer: Self-pay | Admitting: Gastroenterology

## 2013-09-30 ENCOUNTER — Ambulatory Visit (INDEPENDENT_AMBULATORY_CARE_PROVIDER_SITE_OTHER): Payer: 59 | Admitting: Gastroenterology

## 2013-09-30 VITALS — BP 133/87 | HR 73 | Temp 98.0°F | Ht 61.0 in | Wt 162.8 lb

## 2013-09-30 DIAGNOSIS — R197 Diarrhea, unspecified: Secondary | ICD-10-CM

## 2013-09-30 DIAGNOSIS — Z1211 Encounter for screening for malignant neoplasm of colon: Secondary | ICD-10-CM

## 2013-09-30 NOTE — Assessment & Plan Note (Addendum)
UP TO DATE. NEXT TCS 2024.

## 2013-09-30 NOTE — Patient Instructions (Signed)
DRINK WATER TO KEEP YOUR URINE LIGHT YELLOW.  FOLLOW A HIGH FIBER DIET. SEE INFO BELOW.  AVOID ITEMS THAT CAUSE BLOATING & GAS.  GO TO [facility]([phone])  TO DISCUSS LIQUID OR POWDER EQUIVALENT TO A PROBIOTIC.  FOLLOW UP IN 1 YEAR.

## 2013-09-30 NOTE — Progress Notes (Signed)
Reminder in epic °

## 2013-09-30 NOTE — Progress Notes (Signed)
   Subjective:    Patient ID: Verneita Griffes, female    DOB: 01-31-1961, 53 y.o.   MRN: [id number]  ROBERTSON, ANTHONY T, PA-C  HPI CAN'T SWALLOW CAPSULE. NOT TAKING PROBIOTICS. BMs: #4 AND #2. MAY HAVE PAIN IN ABD OFF AND ON IN MID ABD. PT DENIES FEVER, CHILLS, BRBPR, nausea, vomiting, melena, DIARRHEA, problems swallowing, PROBLEMS SEDATION, OR heartburn or indigestion.  Past Medical History  Diagnosis Date  . Hypertension   . Hypercholesterolemia   . Anxiety   . IBS (irritable bowel syndrome)   . Diarrhea 01/20/2013   Past Surgical History  Procedure Laterality Date  . Partial hysterectomy  2011    [facility]  . Tubal ligation  2011    [facility]  . Colonoscopy N/A 01/18/2013    NL ILEUM, NL COLON Bx  . Abdominal hysterectomy     Allergies  Allergen Reactions  . Prednisone Swelling  . Tessalon [Benzonatate] Nausea And Vomiting    Current Outpatient Prescriptions  Medication Sig Dispense Refill  . aspirin 81 MG tablet Take 81 mg by mouth daily.      . cetirizine (ZYRTEC) 10 MG tablet Take 10 mg by mouth daily.      Marland Kitchen PARoxetine (PAXIL) 20 MG tablet Take 20 mg by mouth every morning.      . pravastatin (PRAVACHOL) 20 MG tablet Take 20 mg by mouth daily.      Marland Kitchen triamterene-hydrochlorothiazide (DYAZIDE) 37.5-25 MG per capsule Take 1 capsule by mouth every morning.          Review of Systems     Objective:   Physical Exam  Vitals reviewed. Constitutional: She is oriented to person, place, and time. She appears well-nourished. No distress.  HENT:  Head: Normocephalic and atraumatic.  Mouth/Throat: Oropharynx is clear and moist. No oropharyngeal exudate.  Eyes: Pupils are equal, round, and reactive to light. No scleral icterus.  Neck: Normal range of motion. Neck supple.  Cardiovascular: Normal rate, regular rhythm and normal heart sounds.   Pulmonary/Chest: Effort normal and breath sounds normal. No respiratory distress.  Abdominal: Soft. Bowel sounds are normal.  She exhibits no distension. There is no tenderness.  Musculoskeletal: She exhibits no edema.  Lymphadenopathy:    She has no cervical adenopathy.  Neurological: She is alert and oriented to person, place, and time.  NO FOCAL DEFICITS   Psychiatric:  FLAT AFFECT, NL MOOD           Assessment & Plan:

## 2013-09-30 NOTE — Assessment & Plan Note (Signed)
RESOLVED  DRINK WATER  EAT FIBER. AVOID ITEMS THAT CAUSE BLOATING & GAS. CONSIDER LIQUID OR POWDER EQUIVALENT TO A PROBIOTIC OPV IN 1 YEAR

## 2013-10-04 NOTE — Progress Notes (Signed)
cc'd to pcp 

## 2013-11-04 ENCOUNTER — Other Ambulatory Visit: Payer: Self-pay | Admitting: Obstetrics and Gynecology

## 2013-11-04 DIAGNOSIS — Z139 Encounter for screening, unspecified: Secondary | ICD-10-CM

## 2013-11-15 ENCOUNTER — Ambulatory Visit (HOSPITAL_COMMUNITY): Payer: 59

## 2013-12-06 ENCOUNTER — Ambulatory Visit (HOSPITAL_COMMUNITY)
Admission: RE | Admit: 2013-12-06 | Discharge: 2013-12-06 | Disposition: A | Discharge status: Home / Self Care | Payer: 59 | Source: Ambulatory Visit | Attending: Obstetrics and Gynecology | Admitting: Obstetrics and Gynecology

## 2013-12-06 DIAGNOSIS — Z139 Encounter for screening, unspecified: Secondary | ICD-10-CM

## 2013-12-06 DIAGNOSIS — Z1231 Encounter for screening mammogram for malignant neoplasm of breast: Secondary | ICD-10-CM | POA: Insufficient documentation

## 2013-12-15 ENCOUNTER — Encounter: Payer: Self-pay | Admitting: Obstetrics and Gynecology

## 2013-12-15 ENCOUNTER — Ambulatory Visit (INDEPENDENT_AMBULATORY_CARE_PROVIDER_SITE_OTHER): Payer: 59 | Admitting: Obstetrics and Gynecology

## 2013-12-15 ENCOUNTER — Other Ambulatory Visit (HOSPITAL_COMMUNITY)
Admission: RE | Admit: 2013-12-15 | Discharge: 2013-12-15 | Disposition: A | Discharge status: Home / Self Care | Payer: 59 | Source: Ambulatory Visit | Attending: Obstetrics and Gynecology | Admitting: Obstetrics and Gynecology

## 2013-12-15 VITALS — BP 110/66 | Ht 60.0 in | Wt 162.0 lb

## 2013-12-15 DIAGNOSIS — Z1151 Encounter for screening for human papillomavirus (HPV): Secondary | ICD-10-CM | POA: Insufficient documentation

## 2013-12-15 DIAGNOSIS — Z01419 Encounter for gynecological examination (general) (routine) without abnormal findings: Secondary | ICD-10-CM | POA: Insufficient documentation

## 2013-12-15 DIAGNOSIS — Z1212 Encounter for screening for malignant neoplasm of rectum: Secondary | ICD-10-CM

## 2013-12-15 LAB — HEMOCCULT GUIAC POC 1CARD (OFFICE): Fecal Occult Blood, POC: NEGATIVE

## 2013-12-15 MED ORDER — NYSTATIN 100000 UNIT/GM EX CREA: 1.0000 "application " | TOPICAL_CREAM | Freq: Two times a day (BID) | CUTANEOUS | Status: DC

## 2013-12-15 NOTE — Progress Notes (Signed)
This chart was scribed by Ludger Nutting, Medical Scribe, for Dr. Mallory Shirk on 12/15/13 at 9:32 AM. This chart was reviewed by Dr. Mallory Shirk for accuracy.   Patient ID: [name] [name], female   DOB: May 30, 1961, 53 y.o.   MRN: [id number]  Assessment:  Annual Gyn Exam Vulvar irritation, ?fungus    Plan:  1. pap smear done, next pap due 3 years  2. return prn 3    Annual mammogram advised 4    Will treat as possible fungus with mycolog    Subjective:  [name] [name] is a 53 y.o. female No obstetric history on file. who presents for annual exam. No LMP recorded. Patient has had an ablation. The patient has complaints today of a "cyst" in the crease of her leg/thigh on the left leg. Pt states that the pain comes and goes.  The following portions of the patient's history were reviewed and updated as appropriate: allergies, current medications, past family history, past medical history, past social history, past surgical history and problem list.  She reports having a tubal ligation and endometrial ablation in 2011.   Review of Systems Constitutional: negative Gastrointestinal: negative Genitourinary: negative   Objective:  BP 110/66  Ht 5' (1.524 m)  Wt 162 lb (73.483 kg)  BMI 31.64 kg/m2   BMI: Body mass index is 31.64 kg/(m^2).  General Appearance: Alert, appropriate appearance for age. No acute distress HEENT: Grossly normal Neck / Thyroid:  Cardiovascular: RRR; normal S1, S2, no murmur Lungs: CTA bilaterally Back: No CVAT Breast Exam: No dimpling, nipple retraction or discharge. No masses or nodes., Normal to inspection, Normal breast tissue bilaterally and No masses or nodes.No dimpling, nipple retraction or discharge. Gastrointestinal: Soft, non-tender, no masses or organomegaly Pelvic Exam: Pelvic -  VULVA: normal appearing vulva with no masses, tenderness or lesions, left inner thigh has slightly slight area of excoriation and redness.  VAGINA: normal appearing vagina  with normal color and discharge, no lesions,  CERVIX: normal appearing cervix without discharge or lesions,  UTERUS: uterus is normal size, shape, consistency and nontender,  ADNEXA: normal adnexa in size, nontender and no masses  Rectovaginal: normal rectal, no masses, guaiac negative stool obtained and no rectocele Lymphatic Exam: Non-palpable nodes in neck, clavicular, axillary, or inguinal regions Skin: no rash or abnormalities Neurologic: Normal gait and speech, no tremor  Psychiatric: Alert and oriented, appropriate affect.  Urinalysis:Not done  Mallory Shirk. MD Pgr [phone] 9:07 AM

## 2013-12-17 LAB — CYTOLOGY - PAP

## 2014-05-19 ENCOUNTER — Ambulatory Visit: Payer: 59 | Admitting: Obstetrics and Gynecology

## 2014-05-23 ENCOUNTER — Encounter: Payer: Self-pay | Admitting: Obstetrics and Gynecology

## 2014-05-23 ENCOUNTER — Ambulatory Visit (INDEPENDENT_AMBULATORY_CARE_PROVIDER_SITE_OTHER): Payer: 59 | Admitting: Obstetrics and Gynecology

## 2014-05-23 VITALS — BP 120/80 | Ht 61.0 in | Wt 160.0 lb

## 2014-05-23 DIAGNOSIS — B372 Candidiasis of skin and nail: Secondary | ICD-10-CM

## 2014-05-23 MED ORDER — ACETAMINOPHEN-CODEINE #3 300-30 MG PO TABS: 1.0000 | ORAL_TABLET | ORAL | Status: DC | PRN

## 2014-05-23 MED ORDER — FLUCONAZOLE 150 MG PO TABS: 150.0000 mg | ORAL_TABLET | Freq: Once | ORAL | Status: DC

## 2014-05-23 MED ORDER — KETOCONAZOLE 2 % EX CREA: 1.0000 "application " | TOPICAL_CREAM | Freq: Every day | CUTANEOUS | Status: DC

## 2014-05-23 NOTE — Progress Notes (Addendum)
Patient ID: [name] [name], female   DOB: 01/02/1961, 53 y.o.   MRN: [id number]   [place] [facility] Visit  Patient name: [name] [name] MRN [id number]  Date of birth: 1960/07/21  CC & HPI:  [name] [name] is a 53 y.o. female presenting today for a vaginal rash with associated swelling that she suspects is due to an infection that started 2 weeks ago.  She lists intermittent dysuria as an associated symptom.  She is still urinating the same amount at night but endorses more frequency during the day.  She has tried applying heat pads to her vagina, taking Tylenol and Ibuprofen, as well as taking hot baths with no relief to her symptoms.  She declines prescription medication for hot sweats.  ROS:  All systems have been reviewed and are negative unless otherwise specified in the HPI. Borderline DM  Pertinent History Reviewed:   Reviewed: Significant for  Medical         Past Medical History  Diagnosis Date  . Hypertension   . Hypercholesterolemia   . Anxiety   . IBS (irritable bowel syndrome)   . Diarrhea 01/20/2013                              Surgical Hx:    Past Surgical History  Procedure Laterality Date  . Endometrial ablation  2011    [facility]  . Tubal ligation  2011    [facility]  . Colonoscopy N/A 01/18/2013    NL ILEUM, NL COLON Bx   Medications: Reviewed & Updated - see associated section                      Current outpatient prescriptions: aspirin 81 MG tablet, Take 81 mg by mouth daily., Disp: , Rfl: ;  calcium carbonate 200 MG capsule, Take 250 mg by mouth daily., Disp: , Rfl: ;  cetirizine (ZYRTEC) 10 MG tablet, Take 10 mg by mouth daily., Disp: , Rfl: ;  chlorhexidine (PERIDEX) 0.12 % solution, , Disp: , Rfl: ;  clobetasol (TEMOVATE) 0.05 % external solution, , Disp: , Rfl:  nystatin cream (MYCOSTATIN), Apply 1 application topically 2 (two) times daily., Disp: 30 g, Rfl: 0;  PARoxetine (PAXIL) 20 MG tablet, Take 20 mg by mouth every morning., Disp: , Rfl: ;   pravastatin (PRAVACHOL) 20 MG tablet, Take 20 mg by mouth daily., Disp: , Rfl: ;  triamterene-hydrochlorothiazide (DYAZIDE) 37.5-25 MG per capsule, Take 1 capsule by mouth every morning., Disp: , Rfl:    Social History: Reviewed -  reports that she has never smoked. She has never used smokeless tobacco.  Objective Findings:  Vitals: Blood pressure 120/80, height 5\' 1"  (1.549 m), weight 160 lb (72.576 kg).  Physical Examination: General appearance - alert, well appearing, and in no distress, oriented to person, place, and time and normal appearing weight Pelvic - EXTERNAL GENITALIA: extensive redness between the cheeks of her buttocks and behind the anus in the gluteal crease.  The area is moist and excoriated VAGINA: normal secretions; does not appear to be a yeast infection   Assessment & Plan:   A:  1. Intertrigo  2. Possible atopic dermatitis  P:  1. Dry vulva regimen 2. Diflucan 3. Ketoconazole topical  d/c topical clobetasol  4. Follow-up in 1 wk  This chart was scribed for [name], MD by Donato Schultz, ED Scribe. This patient was seen  in Room 2 and the patient's care was started at 10:03 AM.

## 2014-05-30 ENCOUNTER — Telehealth: Payer: Self-pay | Admitting: Obstetrics and Gynecology

## 2014-05-30 ENCOUNTER — Encounter: Payer: Self-pay | Admitting: Obstetrics and Gynecology

## 2014-05-30 ENCOUNTER — Ambulatory Visit (INDEPENDENT_AMBULATORY_CARE_PROVIDER_SITE_OTHER): Payer: 59 | Admitting: Obstetrics and Gynecology

## 2014-05-30 ENCOUNTER — Ambulatory Visit: Payer: 59 | Admitting: Obstetrics and Gynecology

## 2014-05-30 VITALS — BP 128/80 | Ht 61.0 in | Wt 159.0 lb

## 2014-05-30 DIAGNOSIS — L209 Atopic dermatitis, unspecified: Secondary | ICD-10-CM

## 2014-05-30 MED ORDER — NYSTATIN-TRIAMCINOLONE 100000-0.1 UNIT/GM-% EX OINT: 1.0000 "application " | TOPICAL_OINTMENT | Freq: Two times a day (BID) | CUTANEOUS | Status: DC

## 2014-05-30 NOTE — Progress Notes (Signed)
Patient ID: [name] [name], female   DOB: 04/26/1961, 53 y.o.   MRN: [id number]   [place] [facility] Visit  Patient name: [name] [name] MRN [id number]  Date of birth: 07/13/1960  CC & HPI:  [name] [name] is a 53 y.o. female presenting today for follow-up visit.  She was diagnosed with intertrigo at her last visit and treated with Diflucan and Ketoconazole.  She has been applying the medication she was prescribed and doing the dry vulva therapy with mild improvement to her symptoms.  She has not noticed any sores in her mouth.    ROS:  All systems have been reviewed and are negative unless otherwise specified in the HPI.   Pertinent History Reviewed:   Reviewed: Significant for borderline DM Medical         Past Medical History  Diagnosis Date  . Hypertension   . Hypercholesterolemia   . Anxiety   . IBS (irritable bowel syndrome)   . Diarrhea 01/20/2013                              Surgical Hx:    Past Surgical History  Procedure Laterality Date  . Endometrial ablation  2011    [facility]  . Tubal ligation  2011    [facility]  . Colonoscopy N/A 01/18/2013    NL ILEUM, NL COLON Bx   Medications: Reviewed & Updated - see associated section                      Current outpatient prescriptions: acetaminophen-codeine (TYLENOL #3) 300-30 MG per tablet, Take 1-2 tablets by mouth every 4 (four) hours as needed for moderate pain., Disp: 30 tablet, Rfl: 0;  aspirin 81 MG tablet, Take 81 mg by mouth daily., Disp: , Rfl: ;  calcium carbonate 200 MG capsule, Take 250 mg by mouth daily., Disp: , Rfl: ;  cetirizine (ZYRTEC) 10 MG tablet, Take 10 mg by mouth daily., Disp: , Rfl:  chlorhexidine (PERIDEX) 0.12 % solution, , Disp: , Rfl: ;  clobetasol (TEMOVATE) 0.05 % external solution, , Disp: , Rfl: ;  ketoconazole (NIZORAL) 2 % cream, Apply 1 application topically daily., Disp: 15 g, Rfl: 0;  nystatin cream (MYCOSTATIN), Apply 1 application topically 2 (two) times daily., Disp: 30 g, Rfl: 0;   PARoxetine (PAXIL) 20 MG tablet, Take 20 mg by mouth every morning., Disp: , Rfl:  pravastatin (PRAVACHOL) 20 MG tablet, Take 20 mg by mouth daily., Disp: , Rfl: ;  triamterene-hydrochlorothiazide (DYAZIDE) 37.5-25 MG per capsule, Take 1 capsule by mouth every morning., Disp: , Rfl: ;  fluconazole (DIFLUCAN) 150 MG tablet, Take 1 tablet (150 mg total) by mouth once. (Patient not taking: Reported on 05/30/2014), Disp: 2 tablet, Rfl: 1   Social History: Reviewed -  reports that she has never smoked. She has never used smokeless tobacco.  Objective Findings:  Vitals: Blood pressure 128/80, height 5\' 1"  (1.549 m), weight 159 lb (72.122 kg).  Physical Examination: General appearance - alert, well appearing, and in no distress, oriented to person, place, and time and overweight Pelvic - EXTERNAL GENITALIA: She has chronic moisture and excoriation felt secondary to scratching in between the buttocks  Moderate improvement in extent and severity of redness,  VULVA: normal appearing vulva with no masses, tenderness or lesions,  VAGINA: normal appearing vagina with normal color and discharge, no lesions,  CERVIX: normal appearing cervix without  discharge or lesions,  UTERUS: uterus is normal size, shape, consistency and nontender,  ADNEXA: normal adnexa in size, nontender and no masses  Assessment & Plan:   A:  1. Improving atopic  Dermatitis vs improving yeast  P:  1. Add corticosteroid Rx mytrex  This chart was scribed for [name], MD by Donato Schultz, ED Scribe. This patient was seen in Room 2 and the patient's care was started at 3:12 PM.

## 2014-05-30 NOTE — Progress Notes (Signed)
Patient ID: [name] [name], female   DOB: Aug 29, 1960, 53 y.o.   MRN: [id number] Pt here today for follow up. Pt states that things are better, but still having some pain down there at times.

## 2014-06-01 NOTE — Telephone Encounter (Signed)
Rx re faxed to pharmacy

## 2014-06-06 ENCOUNTER — Other Ambulatory Visit: Payer: Self-pay | Admitting: *Deleted

## 2014-06-06 MED ORDER — NYSTATIN 100000 UNIT/GM EX CREA: 1.0000 "application " | TOPICAL_CREAM | Freq: Two times a day (BID) | CUTANEOUS | Status: DC

## 2014-06-06 NOTE — Telephone Encounter (Signed)
Spoke with Insurance, they will not cover the two creams together but will cover the Nystatin by itself. Dr. Glo Herring is out of the office so I got a verbal order for the Nystatin cream from [facility].

## 2014-07-29 ENCOUNTER — Ambulatory Visit: Payer: 59 | Admitting: Obstetrics and Gynecology

## 2014-08-18 ENCOUNTER — Encounter: Payer: Self-pay | Admitting: Obstetrics and Gynecology

## 2014-08-18 ENCOUNTER — Ambulatory Visit (INDEPENDENT_AMBULATORY_CARE_PROVIDER_SITE_OTHER): Payer: 59 | Admitting: Obstetrics and Gynecology

## 2014-08-18 VITALS — BP 110/72 | Ht 61.0 in | Wt 157.0 lb

## 2014-08-18 DIAGNOSIS — N924 Excessive bleeding in the premenopausal period: Secondary | ICD-10-CM

## 2014-08-18 NOTE — Progress Notes (Addendum)
Patient ID: [name] [name], female   DOB: March 02, 1961, 54 y.o.   MRN: [id number]  This chart was scribed for [name], MD by Donato Schultz, ED Scribe. This patient was seen in Room 1 and the patient's care was started at 10:25 AM.   [facility]  Patient name: [name] [name] MRN [id number]  Date of birth: [birth year]  CC & HPI:  [name] [name] is a 54 y.o. female presenting today for follow-up on her medication used to treat atopic dermatitis.  She states that she started noticing some light spotting this week.  She has not had a menses in years.    ROS:  All systems have been reviewed and are negative unless otherwise indicated in the HPI.   Pertinent History Reviewed:   Reviewed: Significant for endometrial ablation, tubal ligation Medical         Past Medical History  Diagnosis Date   Hypertension    Hypercholesterolemia    Anxiety    IBS (irritable bowel syndrome)    Diarrhea 01/20/2013                              Surgical Hx:    Past Surgical History  Procedure Laterality Date   Endometrial ablation  2011    [facility]   Tubal ligation  2011    [place]   Colonoscopy N/A 01/18/2013    NL ILEUM, NL COLON Bx   Medications: Reviewed & Updated - see associated section                       Current outpatient prescriptions:    acetaminophen-codeine (TYLENOL #3) 300-30 MG per tablet, Take 1-2 tablets by mouth every 4 (four) hours as needed for moderate pain., Disp: 30 tablet, Rfl: 0   aspirin 81 MG tablet, Take 81 mg by mouth daily., Disp: , Rfl:    calcium carbonate 200 MG capsule, Take 250 mg by mouth daily., Disp: , Rfl:    cetirizine (ZYRTEC) 10 MG tablet, Take 10 mg by mouth daily., Disp: , Rfl:    chlorhexidine (PERIDEX) 0.12 % solution, , Disp: , Rfl:    clobetasol (TEMOVATE) 0.05 % external solution, , Disp: , Rfl:    ketoconazole (NIZORAL) 2 % cream, Apply 1 application topically daily., Disp: 15 g, Rfl: 0   lisinopril  (PRINIVIL,ZESTRIL) 10 MG tablet, Take 10 mg by mouth daily., Disp: , Rfl:    nystatin cream (MYCOSTATIN), Apply 1 application topically 2 (two) times daily., Disp: 30 g, Rfl: PRN   PARoxetine (PAXIL) 20 MG tablet, Take 20 mg by mouth every morning., Disp: , Rfl:    pravastatin (PRAVACHOL) 20 MG tablet, Take 20 mg by mouth daily., Disp: , Rfl:    triamterene-hydrochlorothiazide (DYAZIDE) 37.5-25 MG per capsule, Take 1 capsule by mouth every morning., Disp: , Rfl:    Social History: Reviewed -  reports that she has never smoked. She has never used smokeless tobacco.  Objective Findings:  Vitals: Blood pressure 110/72, height 5\' 1"  (1.549 m), weight 157 lb (71.215 kg).  Physical Examination: General appearance - alert, well appearing, and in no distress, oriented to person, place, and time and normal appearing weight Pelvic - normal external genitalia, vulva, vagina, cervix, uterus and adnexa,  VULVA: normal appearing vulva with no masses, tenderness or lesions, NO IRRITATION VAGINA: normal appearing vagina with normal color and discharge, no  lesions, light menstrual blood CERVIX: normal appearing cervix without discharge or lesions,  UTERUS: uterus is normal size, shape, consistency and nontender,  ADNEXA: normal adnexa in size, nontender and no masses   Assessment & Plan:   A:  1. Perimenopausal bleeding , s/p endometrial ablation,2012  P:  1. [place] 2. Transvaginal u/s   I personally performed the services described in this documentation, which was scribed in my presence. The recorded information has been reviewed and considered accurate. It has been edited as necessary during review. [name], MD

## 2014-08-18 NOTE — Progress Notes (Signed)
Patient ID: [name] [name], female   DOB: 1961/03/21, 54 y.o.   MRN: [id number] Pt here today for follow up on medication. Pt states that the medication that she was given has helped and she has noticed a difference.

## 2014-08-19 LAB — TSH: TSH: 1.57 u[IU]/mL (ref 0.450–4.500)

## 2014-08-19 LAB — FOLLICLE STIMULATING HORMONE: FSH: 36.1 m[IU]/mL

## 2014-09-01 ENCOUNTER — Other Ambulatory Visit: Payer: 59

## 2014-09-02 ENCOUNTER — Ambulatory Visit (INDEPENDENT_AMBULATORY_CARE_PROVIDER_SITE_OTHER): Payer: 59

## 2014-09-02 ENCOUNTER — Other Ambulatory Visit: Payer: Self-pay | Admitting: Obstetrics and Gynecology

## 2014-09-02 ENCOUNTER — Ambulatory Visit (INDEPENDENT_AMBULATORY_CARE_PROVIDER_SITE_OTHER): Payer: 59 | Admitting: Obstetrics and Gynecology

## 2014-09-02 ENCOUNTER — Encounter: Payer: Self-pay | Admitting: Obstetrics and Gynecology

## 2014-09-02 VITALS — BP 120/76 | Ht 61.0 in | Wt 154.0 lb

## 2014-09-02 DIAGNOSIS — N950 Postmenopausal bleeding: Secondary | ICD-10-CM

## 2014-09-02 DIAGNOSIS — Z9889 Other specified postprocedural states: Secondary | ICD-10-CM

## 2014-09-02 DIAGNOSIS — N924 Excessive bleeding in the premenopausal period: Secondary | ICD-10-CM

## 2014-09-02 NOTE — Progress Notes (Signed)
Patient ID: [name] [name], female   DOB: April 06, 1961, 54 y.o.   MRN: [id number]   [facility] [facility] Visit  Patient name: [name] [name] MRN [id number]  Date of birth: 08-30-1960  CC & HPI:  [name] [name] is a 54 y.o. female presenting today for follow-up and discussion of [place] results. Pt was seen on 2/12 and reported light menstrual bleeding. Pt lost her husband in February after a car accident.  ROS:  A complete 10 system review of systems was obtained and all systems are negative except as noted in the HPI and PMH.    Pertinent History Reviewed:   Reviewed: Significant for endometrial ablation and tubal ligation Medical         Past Medical History  Diagnosis Date   Hypertension    Hypercholesterolemia    Anxiety    IBS (irritable bowel syndrome)    Diarrhea 01/20/2013                              Surgical Hx:    Past Surgical History  Procedure Laterality Date   Endometrial ablation  2011    [facility]   Tubal ligation  2011    [place]   Colonoscopy N/A 01/18/2013    NL ILEUM, NL COLON Bx   Medications: Reviewed & Updated - see associated section                       Current outpatient prescriptions:    acetaminophen-codeine (TYLENOL #3) 300-30 MG per tablet, Take 1-2 tablets by mouth every 4 (four) hours as needed for moderate pain., Disp: 30 tablet, Rfl: 0   aspirin 81 MG tablet, Take 81 mg by mouth daily., Disp: , Rfl:    calcium carbonate 200 MG capsule, Take 250 mg by mouth daily., Disp: , Rfl:    cetirizine (ZYRTEC) 10 MG tablet, Take 10 mg by mouth daily., Disp: , Rfl:    chlorhexidine (PERIDEX) 0.12 % solution, , Disp: , Rfl:    clobetasol (TEMOVATE) 0.05 % external solution, , Disp: , Rfl:    ketoconazole (NIZORAL) 2 % cream, Apply 1 application topically daily., Disp: 15 g, Rfl: 0   lisinopril (PRINIVIL,ZESTRIL) 10 MG tablet, Take 10 mg by mouth daily., Disp: , Rfl:    nystatin cream (MYCOSTATIN), Apply 1 application topically 2 (two)  times daily., Disp: 30 g, Rfl: PRN   PARoxetine (PAXIL) 20 MG tablet, Take 20 mg by mouth every morning., Disp: , Rfl:    pravastatin (PRAVACHOL) 20 MG tablet, Take 20 mg by mouth daily., Disp: , Rfl:    triamterene-hydrochlorothiazide (DYAZIDE) 37.5-25 MG per capsule, Take 1 capsule by mouth every morning., Disp: , Rfl:    Social History: Reviewed -  reports that she has never smoked. She has never used smokeless tobacco.  Objective Findings:  Vitals: Blood pressure 120/76, height 5\' 1"  (1.549 m), weight 154 lb (69.854 kg).  Physical Examination: discussion only 20 minutes spent in counsel about u/s review, and grief discussion  Assessment & Plan:   A:  1. Postmenopausal bleeding with thin endometrium,      S./P ablation 2011,with benign endometrium on Biopsy,   P:  1. Will follow for now. 2. Reassess in 6 months. Consider Megace  For bleeding     This chart was scribed for [name], MD by Tula Nakayama, ED Scribe. This patient was seen in  office and the patient's care was started at 10:12 AM.   I personally performed the services described in this documentation, which was scribed in my presence. The recorded information has been reviewed and considered accurate. It has been edited as necessary during review. [name], MD

## 2014-09-02 NOTE — Progress Notes (Signed)
Patient ID: [name] [name], female   DOB: Dec 01, 1960, 54 y.o.   MRN: [id number] Pt here today for follow up and results of [place]. Pt states that her husband passed away on the 09/18/22 from a car accident.

## 2014-09-06 ENCOUNTER — Encounter: Payer: Self-pay | Admitting: Gastroenterology

## 2014-11-10 ENCOUNTER — Encounter: Payer: Self-pay | Admitting: Gastroenterology

## 2014-11-10 ENCOUNTER — Ambulatory Visit (INDEPENDENT_AMBULATORY_CARE_PROVIDER_SITE_OTHER): Payer: 59 | Admitting: Gastroenterology

## 2014-11-10 VITALS — BP 119/77 | HR 91 | Temp 97.6°F | Ht 61.0 in | Wt 155.2 lb

## 2014-11-10 DIAGNOSIS — K589 Irritable bowel syndrome without diarrhea: Secondary | ICD-10-CM | POA: Diagnosis not present

## 2014-11-10 NOTE — Progress Notes (Signed)
   Subjective:    Patient ID: [name] [name], female    DOB: Feb 12, 1961, 54 y.o.   MRN: [id number]  Karna Dupes T, PA-C  HPI UNDER A LOT OF STRESS-LOST HUSBAND FEB 2016/NIECE MAR 2016. BMs: W/O PILLS(1-2 #4 OR #7), W/ PILLS(0). ABDOMINAL PAIN: USU. LOWER, ONCE A DAY(LASTS UNTIL SHE GOES TO THE BATHROOM). FEELS BLOATED. TAKING DOLLAR GENERAL OR ANTI-DIARRHEA-2-3X/WEEK. BOWEL IRREGULARITY AND PAIN ARE A PROBLEM. RARE BRBPR-ASSOCIATED WITH SHARP PAIN/RECTAL URGENCY. MILK: EVERY DAY, CHEESE: RARE, ICE CREAM: ONCE A WEEK. NAUSEA: WHEN SHE HAS PAIN. THE OTHER DAY FELT CHILLS AND CHEST PAIN. RARE CONSTIPATED. CAN'T SWALLOW PILLS.  PT DENIES FEVER, CHILLS,  vomiting, melena,  CHEST PAIN, SHORTNESS OF BREATH, CHANGE IN BOWEL IN HABITS, constipation, problems swallowing, OR heartburn or indigestion.   PMHx: NEGATIVE FOR PANCREATITIS, ETOH ONCE A WEEK.  Past Medical History  Diagnosis Date  . Hypertension   . Hypercholesterolemia   . Anxiety   . IBS (irritable bowel syndrome)   . Diarrhea 01/20/2013   Past Surgical History  Procedure Laterality Date  . Endometrial ablation  2011    [facility]  . Tubal ligation  2011    [facility]  . Colonoscopy N/A 01/18/2013    NL ILEUM, NL COLON Bx    Allergies  Allergen Reactions  . Prednisone Swelling  . Tessalon [Benzonatate] Nausea And Vomiting    Current Outpatient Prescriptions  Medication Sig Dispense Refill  . aspirin 81 MG tablet Take 81 mg by mouth daily.    . calcium carbonate 200 MG capsule Take 250 mg by mouth daily.    . cetirizine (ZYRTEC) 10 MG tablet Take 10 mg by mouth daily.    . chlorhexidine (PERIDEX) 0.12 % solution     . clobetasol (TEMOVATE) 0.05 % external solution     . lisinopril (PRINIVIL,ZESTRIL) 10 MG tablet Take 10 mg by mouth daily.    Marland Kitchen PARoxetine (PAXIL) 20 MG tablet Take 20 mg by mouth every morning.    . pravastatin (PRAVACHOL) 20 MG tablet Take 20 mg by mouth daily.    Marland Kitchen triamterene-hydrochlorothiazide  (DYAZIDE) 37.5-25 MG per capsule Take 1 capsule by mouth every morning.    .      .      .       Review of Systems     Objective:   Physical Exam  Constitutional: She is oriented to person, place, and time. She appears well-developed and well-nourished. No distress.  HENT:  Head: Normocephalic and atraumatic.  Mouth/Throat: Oropharynx is clear and moist. No oropharyngeal exudate.  Eyes: Pupils are equal, round, and reactive to light. No scleral icterus.  Neck: Normal range of motion. Neck supple.  Cardiovascular: Normal rate, regular rhythm and normal heart sounds.   Pulmonary/Chest: Effort normal and breath sounds normal. No respiratory distress.  Abdominal: Soft. Bowel sounds are normal. She exhibits no distension. There is no tenderness.  Musculoskeletal: She exhibits no edema.  Lymphadenopathy:    She has no cervical adenopathy.  Neurological: She is alert and oriented to person, place, and time.  NO FOCAL DEFICITS   Psychiatric:  FLAT AFFECT, NL MOOD   Vitals reviewed.         Assessment & Plan:

## 2014-11-10 NOTE — Assessment & Plan Note (Signed)
SYMPTOMS NOT IDEALLY CONTROLLED ON OTC ANTI-DIARRHEAL MEDICATION. PT CAN'T TAKE PILLS SO SHE CAN'T TAKE A PROBIOTIC.  CHANGE TO LACTAID MILK. IF SYMPTOMS NOT IMPROVED AFTER ONE WEEK, ADD VIBERZI. DISCUSSED WITH PHARMACY(BENNY). PT MAY CRUSH PILLS. FOLLOW UP IN 4 MOS.

## 2014-11-10 NOTE — Patient Instructions (Addendum)
CHANGE TO LACTAID MILK.   IF YOUR SYMPTOMS ARE NOT IMPROVED AFTER ONE WEEK, WE CAN ADD VIBERZI. YOU MAY CRUSH PILLS. VIBERZI HELPS WITH PAIN AND DIARRHEA.  YOU CAN USE IMODIUM 1 OR 2 TIMES A DAY IF NEEDED.  YOU MUST CALL IF YOU GO MORE THAN 4 DAYS WITHOUT A BOWEL MOVEMENT OR YOU DEVELOP INCREASE IN NAUSEA, VOMITING, OR ABDOMINAL PAIN.  FOLLOW UP IN 4 MOS.

## 2014-11-24 NOTE — Progress Notes (Signed)
cc'ed to pcp °

## 2015-01-06 ENCOUNTER — Other Ambulatory Visit: Payer: Self-pay | Admitting: Obstetrics and Gynecology

## 2015-01-06 DIAGNOSIS — Z1231 Encounter for screening mammogram for malignant neoplasm of breast: Secondary | ICD-10-CM

## 2015-01-16 ENCOUNTER — Ambulatory Visit (HOSPITAL_COMMUNITY)
Admission: RE | Admit: 2015-01-16 | Discharge: 2015-01-16 | Disposition: A | Discharge status: Home / Self Care | Payer: 59 | Source: Ambulatory Visit | Attending: Obstetrics and Gynecology | Admitting: Obstetrics and Gynecology

## 2015-01-16 DIAGNOSIS — Z1231 Encounter for screening mammogram for malignant neoplasm of breast: Secondary | ICD-10-CM

## 2015-02-06 ENCOUNTER — Encounter: Payer: Self-pay | Admitting: Gastroenterology

## 2015-03-03 ENCOUNTER — Ambulatory Visit: Payer: 59 | Admitting: Obstetrics and Gynecology

## 2015-03-10 ENCOUNTER — Ambulatory Visit (INDEPENDENT_AMBULATORY_CARE_PROVIDER_SITE_OTHER): Payer: 59 | Admitting: Obstetrics and Gynecology

## 2015-03-10 ENCOUNTER — Encounter: Payer: Self-pay | Admitting: Obstetrics and Gynecology

## 2015-03-10 VITALS — BP 110/70 | Ht 61.0 in | Wt 154.0 lb

## 2015-03-10 DIAGNOSIS — N950 Postmenopausal bleeding: Secondary | ICD-10-CM | POA: Insufficient documentation

## 2015-03-10 MED ORDER — ESTROGENS, CONJUGATED 0.625 MG/GM VA CREA: 0.5000 g | TOPICAL_CREAM | Freq: Every day | VAGINAL | Status: DC

## 2015-03-10 NOTE — Progress Notes (Signed)
Patient ID: [name] [name], female   DOB: 1961-02-05, 54 y.o.   MRN: [id number] Pt her today for follow up.

## 2015-03-10 NOTE — Progress Notes (Signed)
[place] [facility] Visit  Patient name: [name] [name] MRN [id number]  Date of birth: 08-Feb-1961  CC & HPI:  [name] [name] is a 54 y.o. female with a history of endometrial ablation in 2011, with benign endometrium on biopsy, presenting today for follow-up of postmenopausal bleeding. She also complains of intermittent vaginal irritation.  Patient was recently referred to a cardiologist due to bradycardia, although her pulse taken by me was 74.   ROS:  A complete review of systems was obtained and all systems are negative except as noted in the HPI and PMH.    Pertinent History Reviewed:   Reviewed: Significant for endometrial ablation, [facility]         Past Medical History  Diagnosis Date  . Hypertension   . Hypercholesterolemia   . Anxiety   . IBS (irritable bowel syndrome)   . Diarrhea 01/20/2013                              Surgical Hx:    Past Surgical History  Procedure Laterality Date  . Endometrial ablation  2011    [facility]  . Tubal ligation  2011    [facility]  . Colonoscopy N/A 01/18/2013    NL ILEUM, NL COLON Bx   Medications: Reviewed & Updated - see associated section                       Current outpatient prescriptions:  .  aspirin 81 MG tablet, Take 81 mg by mouth daily., Disp: , Rfl:  .  calcium carbonate 200 MG capsule, Take 250 mg by mouth daily., Disp: , Rfl:  .  cetirizine (ZYRTEC) 10 MG tablet, Take 10 mg by mouth daily., Disp: , Rfl:  .  clobetasol (TEMOVATE) 0.05 % external solution, , Disp: , Rfl:  .  PARoxetine (PAXIL) 20 MG tablet, Take 20 mg by mouth every morning., Disp: , Rfl:  .  pravastatin (PRAVACHOL) 20 MG tablet, Take 20 mg by mouth daily., Disp: , Rfl:  .  triamterene-hydrochlorothiazide (MAXZIDE-25) 37.5-25 MG per tablet, , Disp: , Rfl:    Social History: Reviewed -  reports that she has never smoked. She has never used smokeless tobacco.  Objective Findings:  Vitals: Blood pressure 110/70, height 5\' 1"  (1.549 m), weight  154 lb (69.854 kg).  Physical Examination: General appearance - alert, well appearing, and in no distress, oriented to person, place, and time and overweight Mental status - alert, oriented to person, place, and time, normal mood, behavior, speech, dress, motor activity, and thought processes, affect appropriate to mood Pelvic - normal external genitalia VULVA: normal appearing vulva with no masses, tenderness or lesions,  VAGINA: normal appearing vagina with normal color and discharge, no lesions,  CERVIX: normal appearing cervix without discharge or lesions,  UTERUS: uterus is normal size, shape, consistency and nontender,  ADNEXA: normal adnexa in size, nontender and no masses  Attempted Endometrial Biopsy: Patient given informed consent, signed copy in the chart, time out was performed. Time out taken. The patient was placed in the lithotomy position and the cervix brought into view with sterile speculum.  Portio of cervix cleansed x 2 with betadine swabs.  A tenaculum was placed in the anterior lip of the cervix. Unable to sound cervix to obtain sample.    Assessment & Plan:   A:  1. Unable to complete endometrial biopsy due to  tight cervix. Cervical stenosis  P:   1. Apply Rx premarin vaginal cream 1x/night for 2 weeks; return to complete procedure in 2+ weeks.   This chart was SCRIBED for Mallory Shirk, MD by Stephania Fragmin, ED Scribe. This patient was seen in room 2, and the patient's care was started at 12:23 PM.  I personally performed the services described in this documentation, which was SCRIBED in my presence. The recorded information has been reviewed and considered accurate. It has been edited as necessary during review. [name], MD

## 2015-04-03 ENCOUNTER — Encounter: Payer: Self-pay | Admitting: *Deleted

## 2015-04-04 ENCOUNTER — Ambulatory Visit (INDEPENDENT_AMBULATORY_CARE_PROVIDER_SITE_OTHER): Payer: 59 | Admitting: Cardiology

## 2015-04-04 ENCOUNTER — Encounter: Payer: Self-pay | Admitting: Cardiology

## 2015-04-04 VITALS — BP 108/80 | HR 72 | Ht 61.0 in | Wt 155.0 lb

## 2015-04-04 DIAGNOSIS — E785 Hyperlipidemia, unspecified: Secondary | ICD-10-CM

## 2015-04-04 DIAGNOSIS — R002 Palpitations: Secondary | ICD-10-CM

## 2015-04-04 DIAGNOSIS — I10 Essential (primary) hypertension: Secondary | ICD-10-CM | POA: Diagnosis not present

## 2015-04-04 DIAGNOSIS — R42 Dizziness and giddiness: Secondary | ICD-10-CM | POA: Diagnosis not present

## 2015-04-04 DIAGNOSIS — R9431 Abnormal electrocardiogram [ECG] [EKG]: Secondary | ICD-10-CM

## 2015-04-04 NOTE — Patient Instructions (Signed)
Your physician recommends that you schedule a follow-up appointment in:  To be determined after tests    Your physician has requested that you have an echocardiogram. Echocardiography is a painless test that uses sound waves to create images of your heart. It provides your doctor with information about the size and shape of your heart and how well your heart's chambers and valves are working. This procedure takes approximately one hour. There are no restrictions for this procedure.     Your physician has recommended that you wear an event monitor for 7 days. Event monitors are medical devices that record the heart's electrical activity. Doctors most often [place] these monitors to diagnose arrhythmias. Arrhythmias are problems with the speed or rhythm of the heartbeat. The monitor is a small, portable device. You can wear one while you do your normal daily activities. This is usually used to diagnose what is causing palpitations/syncope (passing out).   Your physician recommends that you continue on your current medications as directed. Please refer to the Current Medication list given to you today.      Thank you for choosing [facility] !

## 2015-04-04 NOTE — Progress Notes (Signed)
Cardiology Office Note  Date: 04/04/2015   ID: TONETTE KOEHNE, DOB 06/10/1961, MRN [id number]  PCP: Jana Half  Consulting Cardiologist: Rozann Lesches, MD   Chief Complaint  Patient presents with  . Abnormal ECG  . Palpitations    History of Present Illness: [name] [name] is a 54 y.o. female referred by Ms. Inocente Salles for cardiology consultation. She is here today with her daughter. I reviewed the available records. She states that she has had a feeling of "numbness" that comes over her suddenly, she feels weak with this but does not have syncope. She cannot identify any specific trigger. She states that the symptoms have been noticeable for at least the last month since a diagnosis of bronchitis and treatment with antibiotics. Unrelated to these symptoms, she was also having palpitations around the time that she was prescribed trazodone for sleep and anxiety. She states that these symptoms have subsequently resolved since stopping trazodone.  I reviewed her recent ECG, results discussed below. She had a normal QTc and otherwise sinus rhythm with nonspecific T-wave abnormalities. She does not endorse any exertional chest pain or breathlessness.  I reviewed her remaining medications, she reports no change in treatment for hypertension or lipid status.   Past Medical History  Diagnosis Date  . Essential hypertension   . Hyperlipidemia   . Anxiety   . IBS (irritable bowel syndrome)   . Depression     Past Surgical History  Procedure Laterality Date  . Endometrial ablation  2011    [facility]  . Tubal ligation  2011    [facility]  . Colonoscopy N/A 01/18/2013    NL ILEUM, NL COLON Bx    Current Outpatient Prescriptions  Medication Sig Dispense Refill  . aspirin 81 MG tablet Take 81 mg by mouth daily.    . calcium carbonate 200 MG capsule Take 250 mg by mouth daily.    . cetirizine (ZYRTEC) 10 MG tablet Take 10 mg by mouth daily.    . clobetasol (TEMOVATE)  0.05 % external solution     . conjugated estrogens (PREMARIN) vaginal cream Place 4.97 Applicatorfuls vaginally daily. X 2 week , then 2x/wk 42.5 g 12  . PARoxetine (PAXIL) 20 MG tablet Take 20 mg by mouth every morning.    . pravastatin (PRAVACHOL) 20 MG tablet Take 20 mg by mouth daily.    Marland Kitchen triamterene-hydrochlorothiazide (MAXZIDE-25) 37.5-25 MG per tablet Take 1 tablet by mouth daily.      No current facility-administered medications for this visit.    Allergies:  Prednisone and Tessalon   Social History: The patient  reports that she has never smoked. She has never used smokeless tobacco. She reports that she drinks alcohol. She reports that she does not use illicit drugs.   Family History: The patient's family history includes COPD in her father and mother; Diabetes in her maternal uncle; Hyperlipidemia in her brother, brother, and mother; Lung cancer in her maternal grandmother. There is no history of Colon cancer or Colon polyps.   ROS:  Please see the history of present illness. Otherwise, complete review of systems is positive for anxiety, insomnia.  All other systems are reviewed and negative.   Physical Exam: VS:  BP 108/80 mmHg  Pulse 72  Ht 5\' 1"  (1.549 m)  Wt 155 lb (70.308 kg)  BMI 29.30 kg/m2  SpO2 98%, BMI Body mass index is 29.3 kg/(m^2).  Wt Readings from Last 3 Encounters:  04/04/15 155 lb (70.308 kg)  03/10/15 154 lb (69.854 kg)  11/10/14 155 lb 3.2 oz (70.398 kg)     General: Patient appears comfortable at rest. HEENT: Conjunctiva and lids normal, oropharynx clear. Neck: Supple, no elevated JVP or carotid bruits, no thyromegaly. Lungs: Clear to auscultation, nonlabored breathing at rest. Cardiac: Regular rate and rhythm, no S3 or significant systolic murmur, no pericardial rub. Abdomen: Soft, nontender, bowel sounds present, no guarding or rebound. Extremities: No pitting edema, distal pulses 2+. Skin: Warm and dry. Musculoskeletal: No  kyphosis. Neuropsychiatric: Alert and oriented x3, affect grossly appropriate.   ECG: Recent ECG from 03/06/2015 showed normal sinus rhythm with nonspecific T-wave abnormalities and normal QTc.  Recent Labwork: 08/18/2014: TSH 1.570   ASSESSMENT AND PLAN:  1. Intermittent spells of "numbness" and weakness without syncope. This does not seem to correlate with palpitations, which have apparently resolved after stopping trazodone. Recent ECG is abnormal but overall nonspecific. She has had no other major medication changes. Plan is to obtain a 7 day event monitor to exclude any specific arrhythmias, she reports symptoms at least once a week. An echocardiogram will also be obtained to exclude any cardiac structural abnormalities.  2. Essential hypertension, blood pressure is normal today.  3. Hyperlipidemia, on Pravachol.  Current medicines were reviewed at length with the patient today.   Orders Placed This Encounter  Procedures  . Cardiac event monitor  . Echocardiogram    Disposition: Call with results.   Signed, Satira Sark, MD, [facility] 04/04/2015 9:49 AM    [facility] [address] [place], [place] [address] Phone: [phone]; Fax: [phone]

## 2015-04-07 ENCOUNTER — Other Ambulatory Visit: Payer: 59 | Admitting: Obstetrics and Gynecology

## 2015-04-07 ENCOUNTER — Ambulatory Visit (HOSPITAL_COMMUNITY)
Admission: RE | Admit: 2015-04-07 | Discharge: 2015-04-07 | Disposition: A | Discharge status: Home / Self Care | Payer: 59 | Source: Ambulatory Visit | Attending: Cardiology | Admitting: Cardiology

## 2015-04-07 ENCOUNTER — Ambulatory Visit (INDEPENDENT_AMBULATORY_CARE_PROVIDER_SITE_OTHER): Payer: 59

## 2015-04-07 DIAGNOSIS — R42 Dizziness and giddiness: Secondary | ICD-10-CM | POA: Diagnosis present

## 2015-04-07 DIAGNOSIS — R002 Palpitations: Secondary | ICD-10-CM | POA: Insufficient documentation

## 2015-04-10 ENCOUNTER — Encounter: Payer: Self-pay | Admitting: Obstetrics and Gynecology

## 2015-04-10 ENCOUNTER — Other Ambulatory Visit: Payer: Self-pay | Admitting: Obstetrics and Gynecology

## 2015-04-10 ENCOUNTER — Ambulatory Visit (INDEPENDENT_AMBULATORY_CARE_PROVIDER_SITE_OTHER): Payer: 59 | Admitting: Obstetrics and Gynecology

## 2015-04-10 VITALS — BP 120/80 | Ht 61.0 in | Wt 158.0 lb

## 2015-04-10 DIAGNOSIS — N856 Intrauterine synechiae: Secondary | ICD-10-CM

## 2015-04-10 DIAGNOSIS — N882 Stricture and stenosis of cervix uteri: Secondary | ICD-10-CM | POA: Diagnosis not present

## 2015-04-10 DIAGNOSIS — N939 Abnormal uterine and vaginal bleeding, unspecified: Secondary | ICD-10-CM | POA: Diagnosis not present

## 2015-04-10 DIAGNOSIS — N950 Postmenopausal bleeding: Secondary | ICD-10-CM

## 2015-04-10 NOTE — Progress Notes (Signed)
Patient ID: [name] [name], female   DOB: [birth year], 54 y.o.   MRN: [id number]   [facility] Visit  Patient name: [name] [name] MRN [id number]  Date of birth: 1961/04/22  CC & HPI:  [name] [name] is a 54 y.o. female presenting today for attempt at endometrial biopsy, after 2011 [employer] endometrial ablation.  ROS:  She has been on TV premarin this month.  Pertinent History Reviewed:   Reviewed: Significant for amenorrhea x years after first ablation effort Medical         Past Medical History  Diagnosis Date  . Essential hypertension   . Hyperlipidemia   . Anxiety   . IBS (irritable bowel syndrome)   . Depression                               Surgical Hx:    Past Surgical History  Procedure Laterality Date  . Endometrial ablation  2011    [facility]  . Tubal ligation  2011    [facility]  . Colonoscopy N/A 01/18/2013    NL ILEUM, NL COLON Bx   Medications: Reviewed & Updated - see associated section                       Current outpatient prescriptions:  .  aspirin 81 MG tablet, Take 81 mg by mouth daily., Disp: , Rfl:  .  calcium carbonate 200 MG capsule, Take 250 mg by mouth daily., Disp: , Rfl:  .  cetirizine (ZYRTEC) 10 MG tablet, Take 10 mg by mouth daily., Disp: , Rfl:  .  clobetasol (TEMOVATE) 0.05 % external solution, , Disp: , Rfl:  .  conjugated estrogens (PREMARIN) vaginal cream, Place 4.33 Applicatorfuls vaginally daily. X 2 week , then 2x/wk, Disp: 42.5 g, Rfl: 12 .  PARoxetine (PAXIL) 20 MG tablet, Take 20 mg by mouth every morning., Disp: , Rfl:  .  pravastatin (PRAVACHOL) 20 MG tablet, Take 20 mg by mouth daily., Disp: , Rfl:  .  triamterene-hydrochlorothiazide (MAXZIDE-25) 37.5-25 MG per tablet, Take 1 tablet by mouth daily. , Disp: , Rfl:    Social History: Reviewed -  reports that she has never smoked. She has never used smokeless tobacco.  Objective Findings:  Vitals: Blood pressure 120/80, height 5\' 1"  (1.549 m), weight 158 lb  (71.668 kg).  Physical Examination: General appearance - alert, well appearing, and in no distress, oriented to person, place, and time and normal appearing weight Mental status - alert, oriented to person, place, and time, normal mood, behavior, speech, dress, motor activity, and thought processes Abdomen - soft, nontender, nondistended, no masses or organomegaly no abdominal bruits no pulsatile masses no inguinal adenopathy Pelvic - normal external genitalia, vulva, vagina, cervix, uterus and adnexa, VULVA: normal appearing vulva with no masses, tenderness or lesions, VAGINA: normal appearing vagina with normal color and discharge, no lesions, CERVIX: normal appearing cervix without discharge or lesions Cervix only allow cannulation to 2 cm.  Assessment & Plan:   A:  1.cervical stenosis  sispect ashermans syndrome p ablation aUB s/p endometrial  ablation  P:  1. Fu/ pathology. Likely benign

## 2015-04-12 DIAGNOSIS — N856 Intrauterine synechiae: Secondary | ICD-10-CM | POA: Insufficient documentation

## 2015-04-15 ENCOUNTER — Telehealth: Payer: Self-pay | Admitting: Obstetrics and Gynecology

## 2015-04-15 NOTE — Telephone Encounter (Signed)
Left message re benign endometrial/ endocervical biopsy

## 2015-04-17 ENCOUNTER — Telehealth: Payer: Self-pay | Admitting: *Deleted

## 2015-04-18 NOTE — Telephone Encounter (Signed)
Pt informed of benign endometrial biopsy from 04/10/2015.

## 2015-05-08 NOTE — Telephone Encounter (Signed)
Pt was contacted per additional telephone note after this one.

## 2015-06-02 ENCOUNTER — Telehealth: Payer: Self-pay | Admitting: Obstetrics and Gynecology

## 2015-06-02 NOTE — Telephone Encounter (Signed)
Benign Endocerival biopsy in October. Pt having no further bleeding Will see pt in a yr or prn bleeding.

## 2015-10-19 ENCOUNTER — Ambulatory Visit (INDEPENDENT_AMBULATORY_CARE_PROVIDER_SITE_OTHER): Payer: 59 | Admitting: Gastroenterology

## 2015-10-19 ENCOUNTER — Encounter: Payer: Self-pay | Admitting: Gastroenterology

## 2015-10-19 VITALS — BP 132/76 | HR 68 | Temp 97.0°F | Ht 61.0 in | Wt 164.4 lb

## 2015-10-19 DIAGNOSIS — K589 Irritable bowel syndrome without diarrhea: Secondary | ICD-10-CM | POA: Diagnosis not present

## 2015-10-19 DIAGNOSIS — Z1211 Encounter for screening for malignant neoplasm of colon: Secondary | ICD-10-CM

## 2015-10-19 NOTE — Progress Notes (Signed)
   Subjective:    Patient ID: [name] [name], female    DOB: 17-Nov-1960, 55 y.o.   MRN: [id number]  Delman Cheadle, PA-C   HPI MAY HAVE ABDOMINAL PAIN, BLOATING, & GURGLING. HAD A FLARE AFTER EATING AT [place]. FLARES OCCUR AFTER EATING SOMETIMES, MAY HAVE NAUSEA AFTER EATING.  MAY HAPPEN AFTER DRINKING OR EATING. RARE ETOH, ADVIL. ASA EVERY DAY.MAY FEEL CONSTIPATION: 1-2X/MO. RARE INDIGESTION-PRESSURE IN  RIGHT CHEST. CAN'T SWALLOW PILLS BUT NO PROBLEM WITH FOOD. NO ASPIRIN,OR NAPROXEN/ALEVE. BMs: USU AND #4 OR 2, BUT CAN BE A #7.  PT DENIES FEVER, CHILLS, HEMATOCHEZIA, vomiting, melena, CHEST PAIN, SHORTNESS OF BREATH,  CHANGE IN BOWEL IN HABITS, OR problems swallowing.    Past Medical History  Diagnosis Date  . Essential hypertension   . Hyperlipidemia   . Anxiety   . IBS (irritable bowel syndrome)   . Depression     Past Surgical History  Procedure Laterality Date  . Endometrial ablation  2011    [facility]  . Tubal ligation  2011    [facility]  . Colonoscopy N/A 01/18/2013    NL ILEUM, NL COLON Bx   Allergies  Allergen Reactions  . Prednisone Swelling  . Tessalon [Benzonatate] Nausea And Vomiting    Current Outpatient Prescriptions  Medication Sig Dispense Refill  . aspirin 81 MG tablet Take 81 mg by mouth daily.    . calcium carbonate 200 MG capsule Take 250 mg by mouth daily.    . cetirizine (ZYRTEC) 10 MG tablet Take 10 mg by mouth daily.    . clobetasol (TEMOVATE) 0.05 % external solution     . PREMARIN) vaginal cream Place [id number] Applicatorfuls vaginally daily. X 2 week , then 2x/wk    . PARoxetine (PAXIL) 20 MG tablet Take 20 mg by mouth every morning.    . pravastatin (PRAVACHOL) 20 MG tablet Take 20 mg by mouth daily.    Marland Kitchen MAXZIDE-25) 37.5-25 MG per tablet Take 1 tablet by mouth daily.      Review of Systems PER HPI OTHERWISE ALL SYSTEMS ARE NEGATIVE.    Objective:   Physical Exam  Constitutional: She is oriented to person, place, and time. She appears  well-developed and well-nourished. No distress.  HENT:  Head: Normocephalic and atraumatic.  Mouth/Throat: Oropharynx is clear and moist. No oropharyngeal exudate.  Eyes: Pupils are equal, round, and reactive to light. No scleral icterus.  Neck: Normal range of motion. Neck supple.  Cardiovascular: Normal rate, regular rhythm and normal heart sounds.   Pulmonary/Chest: Effort normal and breath sounds normal. No respiratory distress.  Abdominal: Soft. Bowel sounds are normal. She exhibits no distension. There is no tenderness.  Musculoskeletal: She exhibits no edema.  Lymphadenopathy:    She has no cervical adenopathy.  Neurological: She is alert and oriented to person, place, and time.  Psychiatric: She has a normal mood and affect.  Vitals reviewed.     Assessment & Plan:

## 2015-10-19 NOTE — Progress Notes (Signed)
cc'ed to pcp °

## 2015-10-19 NOTE — Assessment & Plan Note (Addendum)
AVERAGE RISK-NEXT TCS IN 2024

## 2015-10-19 NOTE — Patient Instructions (Addendum)
CONTINUE YOUR WEIGHT LOSS EFFORTS. Lose 10 pounds.  DRINK WATER TO KEEP YOUR URINE LIGHT YELLOW.  TO REDUCE BLOATING, GURGLING, ABDOMINAL PAIN, AND DIARRHEA:   1. TAKE A PROBIOTIC DAILY FOR 3 MOS ([employer] BRAND, OR [employer])   2. USE LACTAID OR ALMOND MILK.  3. AVOID ITEMS THAT CAUSE BLOATING & GAS. SEE INFO BELOW.  FOLLOW UP IN 6 MOS.   PLEASE CALL WITH QUESTIONS OR CONCERNS.   BLOATING AND GAS PREVENTION  Although gas may be uncomfortable and embarrassing, it is not life-threatening. Understanding causes, ways to reduce symptoms, and treatment will help most people find some relief.  Points to remember . Everyone has gas in the digestive tract. Marland Kitchen People often believe normal passage of gas to be excessive. . Gas comes from two main sources: swallowed air and normal breakdown of certain foods by harmless bacteria naturally present in the large intestine. . Many foods with carbohydrates can cause gas. Fats and proteins cause little gas. . Foods that may cause gas include o beans  o vegetables, such as broccoli, cabbage, brussels sprouts, onions, artichokes, and asparagus  o fruits, such as pears, apples, and peaches  o whole grains, such as whole wheat and bran  o soft drinks and fruit drinks  o milk and milk products, such as cheese and ice cream, and packaged foods prepared with lactose, such as bread, cereal, and salad dressing  o foods containing sorbitol, such as dietetic foods and sugar free candies and gums . The most common symptoms of gas are belching, flatulence, bloating, and abdominal pain. However, some of these symptoms are often caused by an intestinal disorder, such as irritable bowel syndrome, rather than too much gas. . The most common ways to reduce the discomfort of gas are changing diet, taking nonprescription medicines, and reducing the amount of air swallowed. . Digestive enzymes, such as lactase supplements, actually help digest carbohydrates and  may allow people to eat foods that normally cause gas.

## 2015-10-19 NOTE — Assessment & Plan Note (Signed)
SYMPTOMS EXACERBATED BY LACTOSE AND NOW CLINICALLY IMPROVED, BUT SYMPTOMS NOT IDEALLY CONTROLLED.  DISCUSSED PROCEDURE, BENEFITS, RISKS, AND MANAGEMENT OF IBS-MIXED SYMPTOMS. DRINK WATER TO KEEP YOUR URINE LIGHT YELLOW. TO REDUCE BLOATING, GURGLING, ABDOMINAL PAIN, AND DIARRHEA:  1. TAKE A PROBIOTIC DAILY FOR 3 MOS ([employer] BRAND, OR [employer] HE[employer])   2. USE LACTAID OR ALMOND MILK.  3. AVOID ITEMS THAT CAUSE BLOATING & GAS. SEE INFO BELOW. FOLLOW UP IN 6 MOS. CONSIDER HBT FOR SIBO IF SYMPTOMS NOT RESOLVED. PT WILL CALL WITH QUESTIONS OR CONCERNS.

## 2015-10-19 NOTE — Progress Notes (Signed)
ON RECALL  °

## 2016-02-09 ENCOUNTER — Other Ambulatory Visit: Payer: Self-pay | Admitting: Obstetrics and Gynecology

## 2016-02-09 DIAGNOSIS — Z1231 Encounter for screening mammogram for malignant neoplasm of breast: Secondary | ICD-10-CM

## 2016-02-19 ENCOUNTER — Ambulatory Visit (HOSPITAL_COMMUNITY)
Admission: RE | Admit: 2016-02-19 | Discharge: 2016-02-19 | Disposition: A | Discharge status: Home / Self Care | Payer: 59 | Source: Ambulatory Visit | Attending: Obstetrics and Gynecology | Admitting: Obstetrics and Gynecology

## 2016-02-19 DIAGNOSIS — Z1231 Encounter for screening mammogram for malignant neoplasm of breast: Secondary | ICD-10-CM

## 2016-03-07 ENCOUNTER — Encounter: Payer: Self-pay | Admitting: Gastroenterology

## 2016-12-16 ENCOUNTER — Other Ambulatory Visit: Payer: 59 | Admitting: Obstetrics and Gynecology

## 2016-12-18 ENCOUNTER — Encounter: Payer: Self-pay | Admitting: Obstetrics and Gynecology

## 2016-12-18 ENCOUNTER — Ambulatory Visit (INDEPENDENT_AMBULATORY_CARE_PROVIDER_SITE_OTHER): Payer: 59 | Admitting: Obstetrics and Gynecology

## 2016-12-18 ENCOUNTER — Other Ambulatory Visit (HOSPITAL_COMMUNITY)
Admission: RE | Admit: 2016-12-18 | Discharge: 2016-12-18 | Disposition: A | Discharge status: Home / Self Care | Payer: 59 | Source: Ambulatory Visit | Attending: Obstetrics and Gynecology | Admitting: Obstetrics and Gynecology

## 2016-12-18 VITALS — BP 104/66 | HR 78 | Ht 61.0 in | Wt 162.0 lb

## 2016-12-18 DIAGNOSIS — Z01419 Encounter for gynecological examination (general) (routine) without abnormal findings: Secondary | ICD-10-CM | POA: Insufficient documentation

## 2016-12-18 NOTE — Progress Notes (Addendum)
Patient ID: [name] [name], female   DOB: 1961/04/30, 56 y.o.   MRN: [id number]  Assessment:  Annual Gyn Exam   Plan:  1. pap smear done, next pap due in 3 years  2. return annually or prn 3    Annual mammogram and regular self exams advised 4. Order CBC, CMET, TB test and HepC titer   Subjective:   [name] [name] is a 56 y.o. female G3P3 who presents for annual exam. No LMP recorded. Patient has had an ablation. The patient has no complaints today. Pt states she has been told she is due for TB testing and a HepC titer. Hepatitis C testing is recommended due to her age  The following portions of the patient's history were reviewed and updated as appropriate: allergies, current medications, past family history, past medical history, past social history, past surgical history and problem list. Past Medical History:  Diagnosis Date  . Anxiety   . Depression   . Essential hypertension   . Hyperlipidemia   . IBS (irritable bowel syndrome)     Past Surgical History:  Procedure Laterality Date  . COLONOSCOPY N/A 01/18/2013   NL ILEUM, NL COLON Bx  . ENDOMETRIAL ABLATION  2011   [facility]  . TUBAL LIGATION  2011   [facility]     Current Outpatient Prescriptions:  .  aspirin 81 MG tablet, Take 81 mg by mouth daily., Disp: , Rfl:  .  calcium carbonate 200 MG capsule, Take 250 mg by mouth daily., Disp: , Rfl:  .  cetirizine (ZYRTEC) 10 MG tablet, Take 10 mg by mouth daily., Disp: , Rfl:  .  clobetasol (TEMOVATE) 0.05 % external solution, , Disp: , Rfl:  .  PARoxetine (PAXIL) 40 MG tablet, Take 40 mg by mouth every morning. , Disp: , Rfl:  .  pravastatin (PRAVACHOL) 20 MG tablet, Take 20 mg by mouth daily., Disp: , Rfl:  .  triamterene-hydrochlorothiazide (MAXZIDE-25) 37.5-25 MG per tablet, Take 1 tablet by mouth daily. , Disp: , Rfl:   Review of Systems Constitutional: negative Gastrointestinal: negative Genitourinary: negative  Objective:  BP 104/66 (BP Location: Right Arm,  Patient Position: Sitting, Cuff Size: Normal)   Pulse 78   Ht 5\' 1"  (1.549 m)   Wt 162 lb (73.5 kg)   BMI 30.61 kg/m    BMI: Body mass index is 30.61 kg/m.  General Appearance: Alert, appropriate appearance for age. No acute distress HEENT: Grossly normal Neck / Thyroid:  Cardiovascular: RRR; normal S1, S2, no murmur Lungs: CTA bilaterally Back: No CVAT Breast Exam: No masses or nodes.No dimpling, nipple retraction or discharge. Gastrointestinal: Soft, non-tender, no masses or organomegaly Pelvic Exam:  External genitalia: normal general appearance Vaginal: atrophic vaginal tissues without prolapse or lesions. Well supported.  Cervix: normal appearance Adnexa: normal bimanual exam, non-tender  Uterus: normal single, nontender, tiny, retroverted  Rectovaginal: normal rectal, no masses and guaiac negative stool obtained. Excellent support, No rectocele.  Lymphatic Exam: Non-palpable nodes in neck, clavicular, axillary, or inguinal regions  Skin: no rash or abnormalities Neurologic: Normal gait and speech, no tremor  Psychiatric: Alert and oriented, appropriate affect.  Urinalysis:Not done  Guaiac negative   Mallory Shirk. MD Pgr [phone] 10:13 AM   By signing my name below, I, Hansel Feinstein, attest that this documentation has been prepared under the direction and in the presence of [name], MD. Electronically Signed: Hansel Feinstein, ED Scribe. 12/18/16. 10:13 AM.  I personally performed the services described in  this documentation, which was SCRIBED in my presence. The recorded information has been reviewed and considered accurate. It has been edited as necessary during review. [name], MD

## 2016-12-19 LAB — CBC
HEMATOCRIT: 44.9 % (ref 34.0–46.6)
HEMOGLOBIN: 15.2 g/dL (ref 11.1–15.9)
MCH: 29.3 pg (ref 26.6–33.0)
MCHC: 33.9 g/dL (ref 31.5–35.7)
MCV: 87 fL (ref 79–97)
PLATELETS: 258 10*3/uL (ref 150–379)
RBC: 5.19 x10E6/uL (ref 3.77–5.28)
RDW: 13.3 % (ref 12.3–15.4)
WBC: 5.7 10*3/uL (ref 3.4–10.8)

## 2016-12-19 LAB — COMPREHENSIVE METABOLIC PANEL
A/G RATIO: 1.9 (ref 1.2–2.2)
ALK PHOS: 87 IU/L (ref 39–117)
ALT: 33 IU/L — AB (ref 0–32)
AST: 36 IU/L (ref 0–40)
Albumin: 4.9 g/dL (ref 3.5–5.5)
BILIRUBIN TOTAL: 0.2 mg/dL (ref 0.0–1.2)
BUN/Creatinine Ratio: 20 (ref 9–23)
BUN: 16 mg/dL (ref 6–24)
CHLORIDE: 101 mmol/L (ref 96–106)
CO2: 26 mmol/L (ref 20–29)
Calcium: 9.6 mg/dL (ref 8.7–10.2)
Creatinine, Ser: 0.82 mg/dL (ref 0.57–1.00)
GFR calc non Af Amer: 81 mL/min/{1.73_m2} (ref >59)
GFR, EST AFRICAN AMERICAN: 94 mL/min/{1.73_m2} (ref >59)
Globulin, Total: 2.6 g/dL (ref 1.5–4.5)
Glucose: 63 mg/dL — ABNORMAL LOW (ref 65–99)
POTASSIUM: 4.1 mmol/L (ref 3.5–5.2)
Sodium: 142 mmol/L (ref 134–144)
TOTAL PROTEIN: 7.5 g/dL (ref 6.0–8.5)

## 2016-12-19 LAB — LIPID PANEL
CHOLESTEROL TOTAL: 161 mg/dL (ref 100–199)
Chol/HDL Ratio: 2.8 ratio (ref 0.0–4.4)
HDL: 58 mg/dL (ref >39)
LDL Calculated: 72 mg/dL (ref 0–99)
Triglycerides: 156 mg/dL — ABNORMAL HIGH (ref 0–149)
VLDL Cholesterol Cal: 31 mg/dL (ref 5–40)

## 2016-12-19 LAB — HEPATITIS C ANTIBODY

## 2016-12-19 LAB — TSH: TSH: 2.07 u[IU]/mL (ref 0.450–4.500)

## 2016-12-20 LAB — CYTOLOGY - PAP
Diagnosis: NEGATIVE
HPV (WINDOPATH): NOT DETECTED

## 2017-01-23 ENCOUNTER — Ambulatory Visit (INDEPENDENT_AMBULATORY_CARE_PROVIDER_SITE_OTHER): Payer: 59 | Admitting: Gastroenterology

## 2017-01-23 ENCOUNTER — Encounter: Payer: Self-pay | Admitting: Gastroenterology

## 2017-01-23 ENCOUNTER — Other Ambulatory Visit: Payer: Self-pay

## 2017-01-23 DIAGNOSIS — K625 Hemorrhage of anus and rectum: Secondary | ICD-10-CM

## 2017-01-23 DIAGNOSIS — K580 Irritable bowel syndrome with diarrhea: Secondary | ICD-10-CM

## 2017-01-23 MED ORDER — NA SULFATE-K SULFATE-MG SULF 17.5-3.13-1.6 GM/177ML PO SOLN: 1.0000 | ORAL | 0 refills | Status: DC

## 2017-01-23 NOTE — Assessment & Plan Note (Signed)
SYMPTOMS FAIRLY WELL CONTROLLED AND SYMPTOMS EXACERBATED BY DAIRY. USING LACTAID MILK. STILL EATING CHEESE & DAIRY.  IF YOU CONSUME DAIRY, ADD LACTASE 3-4 PILLS WITH MEALS UP TO FOUR TIMES A DAY. USE IMODIUM IF NEEDED.   FOLLOW UP IN 6 MOS.

## 2017-01-23 NOTE — Progress Notes (Signed)
ON RECALL  °

## 2017-01-23 NOTE — Patient Instructions (Signed)
IF YOU CONSUME DAIRY, ADD LACTASE 3-4 PILLS WITH MEALS UP TO FOUR TIMES A DAY.  USE IMODIUM IF NEEDED.  START BOWEL PREP AUG 2. FOLLOW A FULL LIQUID DIET AUG 2. DO NOT EAT ANYTHING WITH FAT DROPLETS IN IT. SEE INFO BELOW.  HOLD MAXZIDE ON MORNING OF YOUR COLONOSCOPY. COMPLETE COLONOSCOPY AUG 3.  FOLLOW UP IN 6 MOS.    Full Liquid Diet A high-calorie, high-protein supplement should be used to meet your nutritional requirements when the full liquid diet is continued for more than 2 or 3 days. If this diet is to be used for an extended period of time (more than 7 days), a multivitamin should be considered.  Breads and Starches  Allowed: None are allowed   Avoid: Any others.    Potatoes/Pasta/Rice  Allowed: ANY ITEM AS A SOUP OR SMALL PLATE OF MASHED POTATOES OR SCRAMBLED EGGS. (DO NOT EAT MORE THAN ONE SERVING ON THE DAY BEFORE COLONOSCOPY).      Vegetables  Allowed: Strained tomato or vegetable juice. Vegetables pureed in soup.   Avoid: Any others.    Fruit  Allowed: Any strained fruit juices and fruit drinks. Include 1 serving of citrus or vitamin C-enriched fruit juice daily.   Avoid: Any others.  Meat and Meat Substitutes  Allowed: Egg  Avoid: Any meat, fish, or fowl. All cheese.  Milk  Allowed: SOY Milk beverages, including milk shakes and instant breakfast mixes. Smooth yogurt.   Avoid: Any others. Avoid dairy products if not tolerated.    Soups and Combination Foods  Allowed: Broth, strained cream soups. Strained, broth-based soups.   Avoid: Any others.    Desserts and Sweets  Allowed: flavored gelatin, tapioca, ice cream, sherbet, smooth pudding, junket, fruit ices, frozen ice pops, pudding pops, frozen fudge pops, chocolate syrup. Sugar, honey, jelly, syrup.   Avoid: Any others.  Fats and Oils  Allowed: Margarine, butter, cream, sour cream, oils.   Avoid: Any others.  Beverages  Allowed: All.   Avoid: None.  Condiments  Allowed: Iodized  salt, pepper, spices, flavorings. Cocoa powder.   Avoid: Any others.    SAMPLE MEAL PLAN Breakfast   cup orange juice.   1 OR 2 EGGS  1 cup milk.   1 cup beverage (coffee or tea).   Cream or sugar, if desired.    Midmorning Snack  2 SCRAMBLED OR HARD BOILED EGG   Lunch  1 cup cream soup.    cup fruit juice.   1 cup milk.    cup custard.   1 cup beverage (coffee or tea).   Cream or sugar, if desired.    Midafternoon Snack  1 cup milk shake.  Dinner  1 cup cream soup.    cup fruit juice.   1 cup MILK    cup pudding.   1 cup beverage (coffee or tea).   Cream or sugar, if desired.  Evening Snack  1 cup supplement.  To increase calories, add sugar, cream, butter, or margarine if possible. Nutritional supplements will also increase the total calories.

## 2017-01-23 NOTE — Progress Notes (Signed)
   Subjective:    Patient ID: [name] [name], female    DOB: [birth year], 56 y.o.   MRN: [id number]  Jake Samples, PA-C   HPI Drinks lactose free milk. Can't give up ice cream and cheese. HAVING INTERMITTENT WATERY/LOOSE STOOLS: 2X/WEEK. MAY HAVE RECTAL URGENCY BUT NOT HAVING ACCIDENTS. SEEING RECTAL BLEEDING SMALL AMOUNT-x2. Pain in lower/mid abdomen and when she goes to bathroom she feels better. No cigs and occasional ETOH. HEARTBURN: DEPENDS ON W HAT SHE EATS. RARE CONSTIPATION. LAST NL FORMED STOOL: TUES. WEIGHT LOSS: TRYING. APPETITE: FAIR. Last TCS 2014: IH.  PT DENIES FEVER, CHILLS, HEMATEMESIS, nausea, vomiting, melena, CHEST PAIN, SHORTNESS OF BREATH, CHANGE IN BOWEL IN HABITS, problems swallowing, PROBLEMS SEDATION, OR heartburn or indigestion.   Past Medical History:  Diagnosis Date  . Anxiety   . Depression   . Essential hypertension   . Hyperlipidemia   . IBS (irritable bowel syndrome)     Past Surgical History:  Procedure Laterality Date  . COLONOSCOPY N/A 01/18/2013   NL ILEUM, NL COLON Bx  . ENDOMETRIAL ABLATION  2011   [facility]  . TUBAL LIGATION  2011   [facility]    Allergies  Allergen Reactions  . Prednisone Swelling  . Tessalon [Benzonatate] Nausea And Vomiting   Current Outpatient Prescriptions  Medication Sig Dispense Refill  . aspirin 81 MG tablet Take 81 mg by mouth daily.    . calcium carbonate 200 MG capsule Take 250 mg by mouth as needed.     . cetirizine (ZYRTEC) 10 MG tablet Take 10 mg by mouth daily.    . clobetasol (TEMOVATE) 0.05 % external solution as needed.     . loperamide (IMODIUM A-D) 2 MG tablet Take 2 mg by mouth as needed for diarrhea or loose stools.    Marland Kitchen PARoxetine (PAXIL) 40 MG tablet Take 40 mg by mouth every morning.     . pravastatin (PRAVACHOL) 20 MG tablet Take 20 mg by mouth daily.    Marland Kitchen triamterene-hydrochlorothiazide (MAXZIDE-25) 37.5-25 MG per tablet Take 1 tablet by mouth daily.      Review of Systems PER HPI  OTHERWISE ALL SYSTEMS ARE NEGATIVE.    Objective:   Physical Exam  Constitutional: She is oriented to person, place, and time. She appears well-developed and well-nourished. No distress.  HENT:  Head: Normocephalic and atraumatic.  Mouth/Throat: Oropharynx is clear and moist. No oropharyngeal exudate.  Eyes: Pupils are equal, round, and reactive to light. No scleral icterus.  Neck: Normal range of motion. Neck supple.  Cardiovascular: Normal rate, regular rhythm and normal heart sounds.   Pulmonary/Chest: Effort normal and breath sounds normal. No respiratory distress.  Abdominal: Soft. Bowel sounds are normal. She exhibits no distension. There is no tenderness.  Musculoskeletal: She exhibits no edema.  Lymphadenopathy:    She has no cervical adenopathy.  Neurological: She is alert and oriented to person, place, and time.  NO FOCAL DEFICITS  Psychiatric:  SLIGHTLY ANXIOUS MOOD, NL AFFECT  Vitals reviewed.     Assessment & Plan:

## 2017-01-23 NOTE — Progress Notes (Signed)
cc'ed to pcp °

## 2017-01-23 NOTE — Assessment & Plan Note (Addendum)
NEW ONSET. LAST TCS > 3 YRS AGO-IH. DIFFERENTIAL DIAGNOSIS INCLUDES HEMORRHOIDS, COLON POLYPS, AVMs, & LESS LIKELY COLON CA.  START SUPREP AUG 2. FOLLOW A FULL LIQUID DIET AUG 2. DO NOT EAT ANYTHING WITH FAT DROPLETS IN IT.  HANDOUT GIVEN.  HOLD MAXZIDE ON MORNING OF YOUR COLONOSCOPY. COMPLETE COLONOSCOPY AUG 3. DISCUSSED PROCEDURE, BENEFITS, & RISKS: < 1% chance of medication reaction, bleeding, perforation, or rupture of spleen/liver. FOLLOW UP IN 6 MOS.

## 2017-01-23 NOTE — Patient Instructions (Signed)
[facility] FOR TCS  [id number]

## 2017-02-07 ENCOUNTER — Encounter (HOSPITAL_COMMUNITY): Payer: Self-pay | Admitting: *Deleted

## 2017-02-07 ENCOUNTER — Ambulatory Visit (HOSPITAL_COMMUNITY)
Admission: RE | Admit: 2017-02-07 | Discharge: 2017-02-07 | Disposition: A | Discharge status: Home / Self Care | Payer: 59 | Source: Ambulatory Visit | Attending: Gastroenterology | Admitting: Gastroenterology

## 2017-02-07 ENCOUNTER — Encounter (HOSPITAL_COMMUNITY)
Admission: RE | Disposition: A | Discharge status: Home / Self Care | Payer: Self-pay | Source: Ambulatory Visit | Attending: Gastroenterology

## 2017-02-07 DIAGNOSIS — K644 Residual hemorrhoidal skin tags: Secondary | ICD-10-CM | POA: Insufficient documentation

## 2017-02-07 DIAGNOSIS — Z79899 Other long term (current) drug therapy: Secondary | ICD-10-CM | POA: Insufficient documentation

## 2017-02-07 DIAGNOSIS — K648 Other hemorrhoids: Secondary | ICD-10-CM | POA: Insufficient documentation

## 2017-02-07 DIAGNOSIS — F419 Anxiety disorder, unspecified: Secondary | ICD-10-CM | POA: Insufficient documentation

## 2017-02-07 DIAGNOSIS — K625 Hemorrhage of anus and rectum: Secondary | ICD-10-CM

## 2017-02-07 DIAGNOSIS — K589 Irritable bowel syndrome without diarrhea: Secondary | ICD-10-CM | POA: Diagnosis not present

## 2017-02-07 DIAGNOSIS — F329 Major depressive disorder, single episode, unspecified: Secondary | ICD-10-CM | POA: Insufficient documentation

## 2017-02-07 DIAGNOSIS — Z7982 Long term (current) use of aspirin: Secondary | ICD-10-CM | POA: Insufficient documentation

## 2017-02-07 DIAGNOSIS — I10 Essential (primary) hypertension: Secondary | ICD-10-CM | POA: Insufficient documentation

## 2017-02-07 DIAGNOSIS — K921 Melena: Secondary | ICD-10-CM | POA: Diagnosis present

## 2017-02-07 DIAGNOSIS — E785 Hyperlipidemia, unspecified: Secondary | ICD-10-CM | POA: Diagnosis not present

## 2017-02-07 HISTORY — PX: COLONOSCOPY: SHX5424

## 2017-02-07 SURGERY — COLONOSCOPY
Anesthesia: Moderate Sedation

## 2017-02-07 MED ORDER — ONDANSETRON HCL 4 MG/2ML IJ SOLN
INTRAMUSCULAR | Status: DC | PRN
  Administered 2017-02-07: 4 mg via INTRAVENOUS

## 2017-02-07 MED ORDER — ONDANSETRON HCL 4 MG/2ML IJ SOLN
INTRAMUSCULAR | Status: AC
  Filled 2017-02-07: qty 2

## 2017-02-07 MED ORDER — MIDAZOLAM HCL 5 MG/5ML IJ SOLN
INTRAMUSCULAR | Status: AC
  Filled 2017-02-07: qty 10

## 2017-02-07 MED ORDER — STERILE WATER FOR IRRIGATION IR SOLN
Status: DC | PRN
  Administered 2017-02-07: 100 mL

## 2017-02-07 MED ORDER — MIDAZOLAM HCL 5 MG/5ML IJ SOLN
INTRAMUSCULAR | Status: DC | PRN
  Administered 2017-02-07 (×2): 2 mg via INTRAVENOUS

## 2017-02-07 MED ORDER — MEPERIDINE HCL 100 MG/ML IJ SOLN
INTRAMUSCULAR | Status: DC | PRN
Start: 2017-02-07 — End: 2017-02-07
  Administered 2017-02-07: 25 mg via INTRAVENOUS
  Administered 2017-02-07: 50 mg via INTRAVENOUS

## 2017-02-07 MED ORDER — SODIUM CHLORIDE 0.9 % IV SOLN
INTRAVENOUS | Status: DC
  Administered 2017-02-07: 1000 mL via INTRAVENOUS

## 2017-02-07 MED ORDER — MEPERIDINE HCL 100 MG/ML IJ SOLN
INTRAMUSCULAR | Status: AC
  Filled 2017-02-07: qty 2

## 2017-02-07 NOTE — Interval H&P Note (Signed)
History and Physical Interval Note:  02/07/2017 10:55 AM  [name] [name]  has presented today for surgery, with the diagnosis of RECTAL BLEEDING  The various methods of treatment have been discussed with the patient and family. After consideration of risks, benefits and other options for treatment, the patient has consented to  Procedure(s) with comments: COLONOSCOPY (N/A) - 1030  as a surgical intervention .  The patient's history has been reviewed, patient examined, no change in status, stable for surgery.  I have reviewed the patient's chart and labs.  Questions were answered to the patient's satisfaction.     [employer]

## 2017-02-07 NOTE — Op Note (Signed)
[facility] Patient Name: [name] [name] Procedure Date: 02/07/2017 10:51 AM MRN: [id number] Date of Birth: [birth year] Attending MD: Barney Drain , MD CSN: [id number] Age: 56 Admit Type: Outpatient Procedure:                Colonoscopy, DIAGNOSTIC Indications:              Hematochezia Providers:                Barney Drain, MD, Lurline Del, RN, Charlyne Petrin                            RN, RN Referring MD:              Medicines:                Meperidine 75 mg IV, Midazolam 4 mg IV Complications:            No immediate complications. Estimated Blood Loss:     Estimated blood loss: none. Procedure:                Pre-Anesthesia Assessment:                           - Prior to the procedure, a History and Physical                            was performed, and patient medications and                            allergies were reviewed. The patient's tolerance of                            previous anesthesia was also reviewed. The risks                            and benefits of the procedure and the sedation                            options and risks were discussed with the patient.                            All questions were answered, and informed consent                            was obtained. Prior Anticoagulants: The patient has                            taken aspirin, last dose was 1 day prior to                            procedure. ASA Grade Assessment: II - A patient                            with mild systemic disease. After reviewing the  risks and benefits, the patient was deemed in                            satisfactory condition to undergo the procedure.                            After obtaining informed consent, the colonoscope                            was passed under direct vision. Throughout the                            procedure, the patient's blood pressure, pulse, and                            oxygen saturations were  monitored continuously. The                            EC-[id number] ([id number]) scope was introduced through                            the anus and advanced to the the cecum, identified                            by appendiceal orifice and ileocecal valve. The                            ileocecal valve, appendiceal orifice, and rectum                            were photographed. The colonoscopy was somewhat                            difficult due to a tortuous colon. Successful                            completion of the procedure was aided by COLOWRAP.                            The patient tolerated the procedure well. The                            quality of the bowel preparation was excellent. Scope In: 11:18:12 AM Scope Out: 11:34:42 AM Scope Withdrawal Time: 0 hours 13 minutes 19 seconds  Total Procedure Duration: 0 hours 16 minutes 30 seconds  Findings:      The recto-sigmoid colon and sigmoid colon were moderately redundant.      The exam was otherwise without abnormality.      Internal hemorrhoids were found during retroflexion. The hemorrhoids       were small.      External hemorrhoids were found during retroflexion and during digital       exam. The hemorrhoids were moderate. Impression:               - Redundant LEFT  colon.                           - The examination was otherwise normal.                           - RECTAL BLEEDING DUE TO Internal hemorrhoids.                           - External hemorrhoids. Moderate Sedation:      Moderate (conscious) sedation was administered by the endoscopy nurse       and supervised by the endoscopist. The following parameters were       monitored: oxygen saturation, heart rate, blood pressure, and response       to care. Total physician intraservice time was 25 minutes. Recommendation:           - Repeat colonoscopy in 10 years for surveillance.                           - High fiber diet.                           - Continue  present medications.                           - Patient has a contact number available for                            emergencies. The signs and symptoms of potential                            delayed complications were discussed with the                            patient. Return to normal activities tomorrow.                            Written discharge instructions were provided to the                            patient. Procedure Code(s):        --- Professional ---                           [phone], Colonoscopy, flexible; diagnostic, including                            collection of specimen(s) by brushing or washing,                            when performed (separate procedure)                           99152, Moderate sedation services provided by the  same physician or other qualified health care                            professional performing the diagnostic or                            therapeutic service that the sedation supports,                            requiring the presence of an independent trained                            observer to assist in the monitoring of the                            patient's level of consciousness and physiological                            status; initial 15 minutes of intraservice time,                            patient age 41 years or older                           [phone], Moderate sedation services; each additional                            15 minutes intraservice time Diagnosis Code(s):        --- Professional ---                           K64.4, Residual hemorrhoidal skin tags                           K64.8, Other hemorrhoids                           K92.1, Melena (includes Hematochezia)                           Q43.8, Other specified congenital malformations of                            intestine CPT copyright 2016 American Medical Association. All rights reserved. The codes documented in this  report are preliminary and upon coder review may  be revised to meet current compliance requirements. Barney Drain, MD Barney Drain, MD 02/07/2017 11:45:01 AM This report has been signed electronically. Number of Addenda: 0

## 2017-02-07 NOTE — H&P (View-Only) (Signed)
   Subjective:    Patient ID: [name] [name], female    DOB: 13-Oct-1960, 56 y.o.   MRN: [id number]  Jake Samples, PA-C   HPI Drinks lactose free milk. Can't give up ice cream and cheese. HAVING INTERMITTENT WATERY/LOOSE STOOLS: 2X/WEEK. MAY HAVE RECTAL URGENCY BUT NOT HAVING ACCIDENTS. SEEING RECTAL BLEEDING SMALL AMOUNT-x2. Pain in lower/mid abdomen and when she goes to bathroom she feels better. No cigs and occasional ETOH. HEARTBURN: DEPENDS ON W HAT SHE EATS. RARE CONSTIPATION. LAST NL FORMED STOOL: TUES. WEIGHT LOSS: TRYING. APPETITE: FAIR. Last TCS 2014: IH.  PT DENIES FEVER, CHILLS, HEMATEMESIS, nausea, vomiting, melena, CHEST PAIN, SHORTNESS OF BREATH, CHANGE IN BOWEL IN HABITS, problems swallowing, PROBLEMS SEDATION, OR heartburn or indigestion.   Past Medical History:  Diagnosis Date  . Anxiety   . Depression   . Essential hypertension   . Hyperlipidemia   . IBS (irritable bowel syndrome)     Past Surgical History:  Procedure Laterality Date  . COLONOSCOPY N/A 01/18/2013   NL ILEUM, NL COLON Bx  . ENDOMETRIAL ABLATION  2011   [facility]  . TUBAL LIGATION  2011   [facility]    Allergies  Allergen Reactions  . Prednisone Swelling  . Tessalon [Benzonatate] Nausea And Vomiting   Current Outpatient Prescriptions  Medication Sig Dispense Refill  . aspirin 81 MG tablet Take 81 mg by mouth daily.    . calcium carbonate 200 MG capsule Take 250 mg by mouth as needed.     . cetirizine (ZYRTEC) 10 MG tablet Take 10 mg by mouth daily.    . clobetasol (TEMOVATE) 0.05 % external solution as needed.     . loperamide (IMODIUM A-D) 2 MG tablet Take 2 mg by mouth as needed for diarrhea or loose stools.    Marland Kitchen PARoxetine (PAXIL) 40 MG tablet Take 40 mg by mouth every morning.     . pravastatin (PRAVACHOL) 20 MG tablet Take 20 mg by mouth daily.    Marland Kitchen triamterene-hydrochlorothiazide (MAXZIDE-25) 37.5-25 MG per tablet Take 1 tablet by mouth daily.      Review of Systems PER HPI  OTHERWISE ALL SYSTEMS ARE NEGATIVE.    Objective:   Physical Exam  Constitutional: She is oriented to person, place, and time. She appears well-developed and well-nourished. No distress.  HENT:  Head: Normocephalic and atraumatic.  Mouth/Throat: Oropharynx is clear and moist. No oropharyngeal exudate.  Eyes: Pupils are equal, round, and reactive to light. No scleral icterus.  Neck: Normal range of motion. Neck supple.  Cardiovascular: Normal rate, regular rhythm and normal heart sounds.   Pulmonary/Chest: Effort normal and breath sounds normal. No respiratory distress.  Abdominal: Soft. Bowel sounds are normal. She exhibits no distension. There is no tenderness.  Musculoskeletal: She exhibits no edema.  Lymphadenopathy:    She has no cervical adenopathy.  Neurological: She is alert and oriented to person, place, and time.  NO FOCAL DEFICITS  Psychiatric:  SLIGHTLY ANXIOUS MOOD, NL AFFECT  Vitals reviewed.     Assessment & Plan:

## 2017-02-07 NOTE — Discharge Instructions (Signed)
You have small internal hemorrhoids, which cause rectal bleeding. YOU DID NOT HAVE ANY POLYPS.   DRINK WATER TO KEEP YOUR URINE LIGHT YELLOW.  FOLLOW A HIGH FIBER DIET. AVOID ITEMS THAT CAUSE BLOATING. SEE INFO BELOW.  USE PREPARATION H FOUR TIMES  A DAY IF NEEDED TO RELIEVE RECTAL PAIN/PRESSURE/BLEEDING.  Next colonoscopy in 10 years.  Colonoscopy Care After Read the instructions outlined below and refer to this sheet in the next week. These discharge instructions provide you with general information on caring for yourself after you leave the hospital. While your treatment has been planned according to the most current medical practices available, unavoidable complications occasionally occur. If you have any problems or questions after discharge, call DR. Abenezer Odonell, [phone].  ACTIVITY  You may resume your regular activity, but move at a slower pace for the next 24 hours.   Take frequent rest periods for the next 24 hours.   Walking will help get rid of the air and reduce the bloated feeling in your belly (abdomen).   No driving for 24 hours (because of the medicine (anesthesia) used during the test).   You may shower.   Do not sign any important legal documents or operate any machinery for 24 hours (because of the anesthesia used during the test).    NUTRITION  Drink plenty of fluids.   You may resume your normal diet as instructed by your doctor.   Begin with a light meal and progress to your normal diet. Heavy or fried foods are harder to digest and may make you feel sick to your stomach (nauseated).   Avoid alcoholic beverages for 24 hours or as instructed.    MEDICATIONS  You may resume your normal medications.   WHAT YOU CAN EXPECT TODAY  Some feelings of bloating in the abdomen.   Passage of more gas than usual.   Spotting of blood in your stool or on the toilet paper  .  IF YOU HAD POLYPS REMOVED DURING THE COLONOSCOPY:  Eat a soft diet IF YOU HAVE  NAUSEA, BLOATING, ABDOMINAL PAIN, OR VOMITING.    FINDING OUT THE RESULTS OF YOUR TEST Not all test results are available during your visit. DR. Oneida Alar WILL CALL YOU WITHIN 14 DAYS OF YOUR PROCEDUE WITH YOUR RESULTS. Do not assume everything is normal if you have not heard from DR. Demetrick Eichenberger, CALL HER OFFICE AT [phone].  SEEK IMMEDIATE MEDICAL ATTENTION AND CALL THE OFFICE: [phone] IF:  You have more than a spotting of blood in your stool.   Your belly is swollen (abdominal distention).   You are nauseated or vomiting.   You have a temperature over 101F.   You have abdominal pain or discomfort that is severe or gets worse throughout the day.  High-Fiber Diet A high-fiber diet changes your normal diet to include more whole grains, legumes, fruits, and vegetables. Changes in the diet involve replacing refined carbohydrates with unrefined foods. The calorie level of the diet is essentially unchanged. The Dietary Reference Intake (recommended amount) for adult males is 38 grams per day. For adult females, it is 25 grams per day. Pregnant and lactating women should consume 28 grams of fiber per day. Fiber is the intact part of a plant that is not broken down during digestion. Functional fiber is fiber that has been isolated from the plant to provide a beneficial effect in the body. PURPOSE  Increase stool bulk.   Ease and regulate bowel movements.   Lower cholesterol.   REDUCE  RISK OF COLON CANCER  INDICATIONS THAT YOU NEED MORE FIBER  Constipation and hemorrhoids.   Uncomplicated diverticulosis (intestine condition) and irritable bowel syndrome.   Weight management.   As a protective measure against hardening of the arteries (atherosclerosis), diabetes, and cancer.   GUIDELINES FOR INCREASING FIBER IN THE DIET  Start adding fiber to the diet slowly. A gradual increase of about 5 more grams (2 slices of whole-wheat bread, 2 servings of most fruits or vegetables, or 1 bowl  of high-fiber cereal) per day is best. Too rapid an increase in fiber may result in constipation, flatulence, and bloating.   Drink enough water and fluids to keep your urine clear or pale yellow. Water, juice, or caffeine-free drinks are recommended. Not drinking enough fluid may cause constipation.   Eat a variety of high-fiber foods rather than one type of fiber.   Try to increase your intake of fiber through using high-fiber foods rather than fiber pills or supplements that contain small amounts of fiber.   The goal is to change the types of food eaten. Do not supplement your present diet with high-fiber foods, but replace foods in your present diet.    INCLUDE A VARIETY OF FIBER SOURCES  Replace refined and processed grains with whole grains, canned fruits with fresh fruits, and incorporate other fiber sources. White rice, white breads, and most bakery goods contain little or no fiber.   Brown whole-grain rice, buckwheat oats, and many fruits and vegetables are all good sources of fiber. These include: broccoli, Brussels sprouts, cabbage, cauliflower, beets, sweet potatoes, white potatoes (skin on), carrots, tomatoes, eggplant, squash, berries, fresh fruits, and dried fruits.   Cereals appear to be the richest source of fiber. Cereal fiber is found in whole grains and bran. Bran is the fiber-rich outer coat of cereal grain, which is largely removed in refining. In whole-grain cereals, the bran remains. In breakfast cereals, the largest amount of fiber is found in those with "bran" in their names. The fiber content is sometimes indicated on the label.   You may need to include additional fruits and vegetables each day.   In baking, for 1 cup white flour, you may use the following substitutions:   1 cup whole-wheat flour minus 2 tablespoons.   1/2 cup white flour plus 1/2 cup whole-wheat flour.   Hemorrhoids Hemorrhoids are dilated (enlarged) veins around the rectum. Sometimes clots  will form in the veins. This makes them swollen and painful. These are called thrombosed hemorrhoids. Causes of hemorrhoids include:  Constipation.   Straining to have a bowel movement.   HEAVY LIFTING  HOME CARE INSTRUCTIONS  Eat a well balanced diet and drink 6 to 8 glasses of water every day to avoid constipation. You may also use a bulk laxative.   Avoid straining to have bowel movements.   Keep anal area dry and clean.   Do not use a donut shaped pillow or sit on the toilet for long periods. This increases blood pooling and pain.   Move your bowels when your body has the urge; this will require less straining and will decrease pain and pressure.

## 2017-02-11 ENCOUNTER — Encounter (HOSPITAL_COMMUNITY): Payer: Self-pay | Admitting: Gastroenterology

## 2017-03-07 ENCOUNTER — Other Ambulatory Visit: Payer: Self-pay | Admitting: Obstetrics and Gynecology

## 2017-03-07 DIAGNOSIS — Z1231 Encounter for screening mammogram for malignant neoplasm of breast: Secondary | ICD-10-CM

## 2017-03-17 ENCOUNTER — Ambulatory Visit (HOSPITAL_COMMUNITY)
Admission: RE | Admit: 2017-03-17 | Discharge: 2017-03-17 | Disposition: A | Discharge status: Home / Self Care | Payer: 59 | Source: Ambulatory Visit | Attending: Obstetrics and Gynecology | Admitting: Obstetrics and Gynecology

## 2017-03-17 DIAGNOSIS — Z1231 Encounter for screening mammogram for malignant neoplasm of breast: Secondary | ICD-10-CM | POA: Insufficient documentation

## 2017-06-18 ENCOUNTER — Encounter: Payer: Self-pay | Admitting: Gastroenterology

## 2017-08-04 ENCOUNTER — Ambulatory Visit (INDEPENDENT_AMBULATORY_CARE_PROVIDER_SITE_OTHER): Payer: 59 | Admitting: Otolaryngology

## 2017-08-04 DIAGNOSIS — R590 Localized enlarged lymph nodes: Secondary | ICD-10-CM | POA: Diagnosis not present

## 2017-09-29 ENCOUNTER — Ambulatory Visit (INDEPENDENT_AMBULATORY_CARE_PROVIDER_SITE_OTHER): Payer: 59 | Admitting: Otolaryngology

## 2017-09-29 DIAGNOSIS — D487 Neoplasm of uncertain behavior of other specified sites: Secondary | ICD-10-CM | POA: Diagnosis not present

## 2017-09-29 DIAGNOSIS — R590 Localized enlarged lymph nodes: Secondary | ICD-10-CM | POA: Diagnosis not present

## 2017-10-07 ENCOUNTER — Encounter (HOSPITAL_BASED_OUTPATIENT_CLINIC_OR_DEPARTMENT_OTHER): Payer: Self-pay | Admitting: *Deleted

## 2017-10-09 ENCOUNTER — Other Ambulatory Visit: Payer: Self-pay | Admitting: Otolaryngology

## 2017-10-09 ENCOUNTER — Encounter (HOSPITAL_COMMUNITY)
Admission: RE | Admit: 2017-10-09 | Discharge: 2017-10-09 | Disposition: A | Discharge status: Home / Self Care | Payer: 59 | Source: Ambulatory Visit | Attending: Otolaryngology | Admitting: Otolaryngology

## 2017-10-09 DIAGNOSIS — I10 Essential (primary) hypertension: Secondary | ICD-10-CM | POA: Diagnosis not present

## 2017-10-09 DIAGNOSIS — Z01810 Encounter for preprocedural cardiovascular examination: Secondary | ICD-10-CM | POA: Insufficient documentation

## 2017-10-09 DIAGNOSIS — Z01812 Encounter for preprocedural laboratory examination: Secondary | ICD-10-CM | POA: Insufficient documentation

## 2017-10-09 LAB — BASIC METABOLIC PANEL
Anion gap: 10 (ref 5–15)
BUN: 19 mg/dL (ref 6–20)
CALCIUM: 9.2 mg/dL (ref 8.9–10.3)
CO2: 27 mmol/L (ref 22–32)
Chloride: 101 mmol/L (ref 101–111)
Creatinine, Ser: 0.85 mg/dL (ref 0.44–1.00)
GFR calc Af Amer: >60 mL/min (ref >60)
GLUCOSE: 124 mg/dL — AB (ref 65–99)
Potassium: 4.2 mmol/L (ref 3.5–5.1)
Sodium: 138 mmol/L (ref 135–145)

## 2017-10-14 ENCOUNTER — Ambulatory Visit (HOSPITAL_BASED_OUTPATIENT_CLINIC_OR_DEPARTMENT_OTHER): Payer: 59 | Admitting: Anesthesiology

## 2017-10-14 ENCOUNTER — Encounter (HOSPITAL_BASED_OUTPATIENT_CLINIC_OR_DEPARTMENT_OTHER): Payer: Self-pay | Admitting: *Deleted

## 2017-10-14 ENCOUNTER — Ambulatory Visit (HOSPITAL_BASED_OUTPATIENT_CLINIC_OR_DEPARTMENT_OTHER)
Admission: RE | Admit: 2017-10-14 | Discharge: 2017-10-14 | Disposition: A | Discharge status: Home / Self Care | Payer: 59 | Source: Ambulatory Visit | Attending: Otolaryngology | Admitting: Otolaryngology

## 2017-10-14 ENCOUNTER — Encounter (HOSPITAL_BASED_OUTPATIENT_CLINIC_OR_DEPARTMENT_OTHER)
Admission: RE | Disposition: A | Discharge status: Home / Self Care | Payer: Self-pay | Source: Ambulatory Visit | Attending: Otolaryngology

## 2017-10-14 DIAGNOSIS — I10 Essential (primary) hypertension: Secondary | ICD-10-CM | POA: Diagnosis not present

## 2017-10-14 DIAGNOSIS — Z6832 Body mass index (BMI) 32.0-32.9, adult: Secondary | ICD-10-CM | POA: Diagnosis not present

## 2017-10-14 DIAGNOSIS — R590 Localized enlarged lymph nodes: Secondary | ICD-10-CM | POA: Diagnosis not present

## 2017-10-14 DIAGNOSIS — F418 Other specified anxiety disorders: Secondary | ICD-10-CM | POA: Diagnosis not present

## 2017-10-14 DIAGNOSIS — E669 Obesity, unspecified: Secondary | ICD-10-CM | POA: Diagnosis not present

## 2017-10-14 HISTORY — PX: EXCISION MASS NECK: SHX6703

## 2017-10-14 SURGERY — EXCISION, MASS, NECK
Anesthesia: General | Site: Neck | Laterality: Left

## 2017-10-14 MED ORDER — MIDAZOLAM HCL 2 MG/2ML IJ SOLN
1.0000 mg | INTRAMUSCULAR | Status: DC | PRN
  Administered 2017-10-14: 2 mg via INTRAVENOUS

## 2017-10-14 MED ORDER — LIDOCAINE HCL (CARDIAC) 20 MG/ML IV SOLN
INTRAVENOUS | Status: DC | PRN
  Administered 2017-10-14: 60 mg via INTRAVENOUS

## 2017-10-14 MED ORDER — OXYCODONE HCL 5 MG PO TABS: 5.0000 mg | ORAL_TABLET | Freq: Once | ORAL | Status: DC | PRN

## 2017-10-14 MED ORDER — ONDANSETRON HCL 4 MG/2ML IJ SOLN
INTRAMUSCULAR | Status: AC
  Filled 2017-10-14: qty 2

## 2017-10-14 MED ORDER — LIDOCAINE HCL (CARDIAC) 20 MG/ML IV SOLN
INTRAVENOUS | Status: AC
  Filled 2017-10-14: qty 5

## 2017-10-14 MED ORDER — PROMETHAZINE HCL 25 MG/ML IJ SOLN: 6.2500 mg | INTRAMUSCULAR | Status: DC | PRN

## 2017-10-14 MED ORDER — AMOXICILLIN 875 MG PO TABS
875.0000 mg | ORAL_TABLET | Freq: Two times a day (BID) | ORAL | 0 refills | Status: AC
Start: 2017-10-14 — End: 2017-10-19

## 2017-10-14 MED ORDER — DEXAMETHASONE SODIUM PHOSPHATE 10 MG/ML IJ SOLN
INTRAMUSCULAR | Status: AC
  Filled 2017-10-14: qty 1

## 2017-10-14 MED ORDER — FENTANYL CITRATE (PF) 100 MCG/2ML IJ SOLN
INTRAMUSCULAR | Status: AC
  Filled 2017-10-14: qty 2

## 2017-10-14 MED ORDER — ONDANSETRON HCL 4 MG/2ML IJ SOLN
INTRAMUSCULAR | Status: DC | PRN
  Administered 2017-10-14: 4 mg via INTRAVENOUS

## 2017-10-14 MED ORDER — FENTANYL CITRATE (PF) 100 MCG/2ML IJ SOLN
50.0000 ug | INTRAMUSCULAR | Status: DC | PRN
  Administered 2017-10-14 (×2): 50 ug via INTRAVENOUS

## 2017-10-14 MED ORDER — PHENYLEPHRINE HCL 10 MG/ML IJ SOLN
INTRAMUSCULAR | Status: DC | PRN
  Administered 2017-10-14: 120 ug via INTRAVENOUS
  Administered 2017-10-14: 40 ug via INTRAVENOUS
  Administered 2017-10-14: 80 ug via INTRAVENOUS

## 2017-10-14 MED ORDER — NEOSTIGMINE METHYLSULFATE 5 MG/5ML IV SOSY
PREFILLED_SYRINGE | INTRAVENOUS | Status: AC
  Filled 2017-10-14: qty 5

## 2017-10-14 MED ORDER — PROPOFOL 10 MG/ML IV BOLUS
INTRAVENOUS | Status: DC | PRN
  Administered 2017-10-14: 200 mg via INTRAVENOUS

## 2017-10-14 MED ORDER — FENTANYL CITRATE (PF) 100 MCG/2ML IJ SOLN: 25.0000 ug | INTRAMUSCULAR | Status: DC | PRN

## 2017-10-14 MED ORDER — LACTATED RINGERS IV SOLN
INTRAVENOUS | Status: DC
  Administered 2017-10-14: 09:00:00 via INTRAVENOUS

## 2017-10-14 MED ORDER — EPHEDRINE SULFATE 50 MG/ML IJ SOLN
INTRAMUSCULAR | Status: DC | PRN
  Administered 2017-10-14: 10 mg via INTRAVENOUS

## 2017-10-14 MED ORDER — MIDAZOLAM HCL 2 MG/2ML IJ SOLN
INTRAMUSCULAR | Status: AC
  Filled 2017-10-14: qty 2

## 2017-10-14 MED ORDER — LIDOCAINE-EPINEPHRINE 1 %-1:100000 IJ SOLN
INTRAMUSCULAR | Status: DC | PRN
  Administered 2017-10-14: 2.5 mL

## 2017-10-14 MED ORDER — OXYCODONE HCL 5 MG/5ML PO SOLN: 5.0000 mg | Freq: Once | ORAL | Status: DC | PRN

## 2017-10-14 MED ORDER — ROCURONIUM BROMIDE 10 MG/ML (PF) SYRINGE
PREFILLED_SYRINGE | INTRAVENOUS | Status: AC
  Filled 2017-10-14: qty 5

## 2017-10-14 MED ORDER — PROPOFOL 10 MG/ML IV BOLUS
INTRAVENOUS | Status: AC
  Filled 2017-10-14: qty 40

## 2017-10-14 MED ORDER — CEFAZOLIN SODIUM-DEXTROSE 2-3 GM-%(50ML) IV SOLR
INTRAVENOUS | Status: DC | PRN
  Administered 2017-10-14: 2 g via INTRAVENOUS

## 2017-10-14 MED ORDER — SCOPOLAMINE 1 MG/3DAYS TD PT72: 1.0000 | MEDICATED_PATCH | Freq: Once | TRANSDERMAL | Status: DC | PRN

## 2017-10-14 SURGICAL SUPPLY — 73 items
BENZOIN TINCTURE PRP APPL 2/3 (GAUZE/BANDAGES/DRESSINGS) IMPLANT
BLADE CLIPPER SURG (BLADE) ×3 IMPLANT
BLADE SURG 15 STRL LF DISP TIS (BLADE) ×1 IMPLANT
BLADE SURG 15 STRL SS (BLADE) ×2
CANISTER SUCT 1200ML W/VALVE (MISCELLANEOUS) ×3 IMPLANT
CLEANER CAUTERY TIP 5X5 PAD (MISCELLANEOUS) IMPLANT
CLIP VESOCCLUDE MED 6/CT (CLIP) IMPLANT
CLIP VESOCCLUDE SM WIDE 6/CT (CLIP) IMPLANT
CLOSURE WOUND 1/4X4 (GAUZE/BANDAGES/DRESSINGS)
CORD BIPOLAR FORCEPS 12FT (ELECTRODE) IMPLANT
COVER BACK TABLE 60X90IN (DRAPES) ×3 IMPLANT
COVER MAYO STAND STRL (DRAPES) ×3 IMPLANT
DECANTER SPIKE VIAL GLASS SM (MISCELLANEOUS) IMPLANT
DERMABOND ADVANCED (GAUZE/BANDAGES/DRESSINGS) ×2
DERMABOND ADVANCED .7 DNX12 (GAUZE/BANDAGES/DRESSINGS) ×1 IMPLANT
DRAIN JACKSON RD 7FR 3/32 (WOUND CARE) IMPLANT
DRAIN PENROSE 1/4X12 LTX STRL (WOUND CARE) IMPLANT
DRAIN TLS ROUND 10FR (DRAIN) IMPLANT
DRAPE U-SHAPE 76X120 STRL (DRAPES) ×3 IMPLANT
ELECT COATED BLADE 2.86 ST (ELECTRODE) ×3 IMPLANT
ELECT NEEDLE BLADE 2-5/6 (NEEDLE) IMPLANT
ELECT PAIRED SUBDERMAL (MISCELLANEOUS)
ELECT REM PT RETURN 9FT ADLT (ELECTROSURGICAL) ×3
ELECTRODE PAIRED SUBDERMAL (MISCELLANEOUS) IMPLANT
ELECTRODE REM PT RTRN 9FT ADLT (ELECTROSURGICAL) ×1 IMPLANT
EVACUATOR SILICONE 100CC (DRAIN) IMPLANT
FORCEPS BIPOLAR SPETZLER 8 1.0 (NEUROSURGERY SUPPLIES) IMPLANT
GAUZE SPONGE 4X4 12PLY STRL LF (GAUZE/BANDAGES/DRESSINGS) IMPLANT
GAUZE SPONGE 4X4 16PLY XRAY LF (GAUZE/BANDAGES/DRESSINGS) IMPLANT
GLOVE BIO SURGEON STRL SZ 6.5 (GLOVE) ×2 IMPLANT
GLOVE BIO SURGEON STRL SZ7.5 (GLOVE) ×3 IMPLANT
GLOVE BIO SURGEONS STRL SZ 6.5 (GLOVE) ×1
GLOVE BIOGEL PI IND STRL 7.0 (GLOVE) ×1 IMPLANT
GLOVE BIOGEL PI INDICATOR 7.0 (GLOVE) ×2
GLOVE EXAM NITRILE MD LF STRL (GLOVE) ×3 IMPLANT
GOWN STRL REUS W/ TWL LRG LVL3 (GOWN DISPOSABLE) ×2 IMPLANT
GOWN STRL REUS W/TWL LRG LVL3 (GOWN DISPOSABLE) ×4
HEMOSTAT SURGICEL .5X2 ABSORB (HEMOSTASIS) IMPLANT
LOCATOR NERVE 3 VOLT (DISPOSABLE) IMPLANT
NEEDLE HYPO 25X1 1.5 SAFETY (NEEDLE) ×3 IMPLANT
NEEDLE PRECISIONGLIDE 27X1.5 (NEEDLE) IMPLANT
NS IRRIG 1000ML POUR BTL (IV SOLUTION) ×3 IMPLANT
PACK BASIN DAY SURGERY FS (CUSTOM PROCEDURE TRAY) ×3 IMPLANT
PAD CLEANER CAUTERY TIP 5X5 (MISCELLANEOUS)
PENCIL BUTTON HOLSTER BLD 10FT (ELECTRODE) ×3 IMPLANT
PIN SAFETY STERILE (MISCELLANEOUS) IMPLANT
PROBE NERVBE PRASS .33 (MISCELLANEOUS) IMPLANT
SHEARS HARMONIC 9CM CVD (BLADE) IMPLANT
SLEEVE SCD COMPRESS KNEE MED (MISCELLANEOUS) ×3 IMPLANT
SPONGE GAUZE 2X2 8PLY STER LF (GAUZE/BANDAGES/DRESSINGS)
SPONGE GAUZE 2X2 8PLY STRL LF (GAUZE/BANDAGES/DRESSINGS) IMPLANT
STAPLER VISISTAT 35W (STAPLE) IMPLANT
STRIP CLOSURE SKIN 1/4X4 (GAUZE/BANDAGES/DRESSINGS) IMPLANT
SUCTION FRAZIER HANDLE 10FR (MISCELLANEOUS) ×2
SUCTION TUBE FRAZIER 10FR DISP (MISCELLANEOUS) ×1 IMPLANT
SUT ETHILON 3 0 PS 1 (SUTURE) IMPLANT
SUT ETHILON 4 0 PS 2 18 (SUTURE) IMPLANT
SUT PROLENE 5 0 P 3 (SUTURE) IMPLANT
SUT SILK 3 0 TIES 17X18 (SUTURE)
SUT SILK 3-0 18XBRD TIE BLK (SUTURE) IMPLANT
SUT SILK 4 0 TIES 17X18 (SUTURE) IMPLANT
SUT VIC AB 3-0 FS2 27 (SUTURE) IMPLANT
SUT VIC AB 4-0 P-3 18XBRD (SUTURE) IMPLANT
SUT VIC AB 4-0 P3 18 (SUTURE)
SUT VIC AB 4-0 RB1 27 (SUTURE)
SUT VIC AB 4-0 RB1 27X BRD (SUTURE) IMPLANT
SUT VICRYL 4-0 PS2 18IN ABS (SUTURE) ×3 IMPLANT
SYR BULB 3OZ (MISCELLANEOUS) ×3 IMPLANT
SYR CONTROL 10ML LL (SYRINGE) ×3 IMPLANT
TOWEL OR 17X24 6PK STRL BLUE (TOWEL DISPOSABLE) ×3 IMPLANT
TRAY DSU PREP LF (CUSTOM PROCEDURE TRAY) ×3 IMPLANT
TUBE CONNECTING 20'X1/4 (TUBING) ×1
TUBE CONNECTING 20X1/4 (TUBING) ×2 IMPLANT

## 2017-10-14 NOTE — H&P (Signed)
Cc: Left neck mass  HPI: The patient is a 57 year old female who returns today for her follow-up evaluation.  The patient was last seen 2 months ago.  At that time, she was noted to have a 1 cm firm neck mass at the left Level 5 area.  The ultrasound findings were consistent with a lymph node.  The decision at that time was to proceed with conservative observation.  According to the patient, she has not noted any significant change in the left neck mass.  It is still firm to palpation.  It is slightly mobile.  It is nontender to touch.  The patient denies any dysphagia, odynophagia, or dyspnea.  She has no other symptoms. No other ENT, GI, or respiratory issue noted since the last visit.   Exam: General: Communicates without difficulty, well nourished, no acute distress. Head: Normocephalic, no evidence injury, no tenderness, facial buttresses intact without stepoff. Eyes: PERRL, EOMI. No scleral icterus, conjunctivae clear. Neuro: CN II exam reveals vision grossly intact.  No nystagmus at any point of gaze. Ears: Auricles well formed without lesions.  Ear canals are intact without mass or lesion.  No erythema or edema is appreciated.  The TMs are intact without fluid. Nose: External evaluation reveals normal support and skin without lesions.  Dorsum is intact.  Anterior rhinoscopy reveals healthy pink mucosa over anterior aspect of inferior turbinates and intact septum.  No purulence noted. Oral:  Oral cavity and oropharynx are intact, symmetric, without erythema or edema.  Mucosa is moist without lesions. Neck: Full range of motion without pain.  A 1 cm, firm, level V right neck mass is noted.  Thyroid bed within normal limits to palpation.  Parotid glands and submandibular glands equal bilaterally without mass.  Trachea is midline. Neuro:  CN 2-12 grossly intact. Gait normal. Vestibular: No nystagmus at any point of gaze. The cerebellar examination is unremarkable.   Assessment 1.  The patient continues  to have a 1 cm firm left Level 5 neck mass.  Her previous ultrasound was consistent with a lymph node.   Plan  1.  The physical exam findings are reviewed with the patient.  2.  The treatment options are extensively discussed.  The options include conservative observation versus surgical excision.  The risks, benefits, details of the procedure are reviewed with the patient.   3.  The patient would like to proceed with surgical excision.  We will schedule the procedure in accordance with the patient's schedule.

## 2017-10-14 NOTE — Transfer of Care (Signed)
Immediate Anesthesia Transfer of Care Note  Patient: LARENDA REEDY  Procedure(s) Performed: EXCISION LEFT MASS NECK (Left Neck)  Patient Location: PACU  Anesthesia Type:General  Level of Consciousness: awake, alert  and oriented  Airway & Oxygen Therapy: Patient Spontanous Breathing and Patient connected to face mask oxygen  Post-op Assessment: Report given to RN and Post -op Vital signs reviewed and stable  Post vital signs: Reviewed and stable  Last Vitals:  Vitals Value Taken Time  BP 119/95 10/14/2017 12:01 PM  Temp    Pulse 95 10/14/2017 12:02 PM  Resp 11 10/14/2017 12:02 PM  SpO2 97 % 10/14/2017 12:02 PM  Vitals shown include unvalidated device data.  Last Pain:  Vitals:   10/14/17 0823  TempSrc: Oral  PainSc: 0-No pain         Complications: No apparent anesthesia complications

## 2017-10-14 NOTE — Anesthesia Postprocedure Evaluation (Signed)
Anesthesia Post Note  Patient: [name] [name]  Procedure(s) Performed: EXCISION LEFT MASS NECK (Left Neck)     Patient location during evaluation: PACU Anesthesia Type: General Level of consciousness: awake and alert Pain management: pain level controlled Vital Signs Assessment: post-procedure vital signs reviewed and stable Respiratory status: spontaneous breathing, nonlabored ventilation and respiratory function stable Cardiovascular status: blood pressure returned to baseline and stable Postop Assessment: no apparent nausea or vomiting Anesthetic complications: no    Last Vitals:  Vitals:   10/14/17 1232 10/14/17 1255  BP: 115/79 114/68  Pulse:  96  Resp: (!) 21 18  Temp:  36.5 C  SpO2:  97%    Last Pain:  Vitals:   10/14/17 1255  TempSrc:   PainSc: 0-No pain                 Audry Pili

## 2017-10-14 NOTE — Op Note (Signed)
DATE OF PROCEDURE: 10/14/2017  OPERATIVE REPORT   SURGEON: Leta Baptist, MD  PREOPERATIVE DIAGNOSIS: Left posterior neck lymphadenopathy  POSTOPERATIVE DIAGNOSIS: Left posterior neck lymphadenopathy  PROCEDURES PERFORMED: Excisional biopsy of left posterior cervical lymph node (CPT 38510)  ANESTHESIA: General laryngeal mask anesthesia.  COMPLICATIONS: None.  ESTIMATED BLOOD LOSS: Minimal.  INDICATION FOR PROCEDURE: The patient is a 57 y.o. female with a history of a 1 cm firm neck mass at the left level V area. Her ultrasound was consistent with a lymph node. It was conservatively observed. Over the past 3 months, the lymph node has persisted. It was nontender to touch. The patient has decided to proceed with excisional biopsy of the lymph node. The risks, benefits, alternatives, and details of the procedure were discussed with the patient. Questions were invited and answered. Informed consent was obtained.  DESCRIPTION OF PROCEDURE: The patient was taken to the operating room and placed supine on the operating table. General laryngeal mask anesthesia was induced by the anesthesiologist.   The patient was positioned and prepped and draped in the sterile fashion for left neck surgery. A 1 cm left level V neck mass was noted. 1% lidocaine with 1-100,000 epinephrine was infiltrated at the planned site of incision. A transverse lower neck incision was made. The incision was carried down to the level of the subcutaneous tissue. The posterior neck musculature was carefully dissected to expose the underlying lymph node. A 1 cm lymph node was noted. The entire lymph node was resected free from the surrounding soft tissue. The surgical site was copiously irrigated. The incision was closed in layers with 4-0 Vicryl and Dermabond.  The care of the patient was turned over to the anesthesiologist. The patient was awakened from anesthesia without difficulty. she was extubated and  transferred to the recovery room in good condition.  OPERATIVE FINDINGS: A firm 1 cm left level V lymph node.  SPECIMEN: Left neck lymph node.  FOLLOWUP CARE: The patient will be discharged home once she is awake and alert. She will follow up in my office in 1 week.

## 2017-10-14 NOTE — Anesthesia Preprocedure Evaluation (Addendum)
Anesthesia Evaluation  Patient identified by MRN, date of birth, ID band Patient awake    Reviewed: Allergy & Precautions, NPO status , Patient's Chart, lab work & pertinent test results  Airway Mallampati: III   Neck ROM: Full    Dental  (+) Dental Advisory Given, Partial Upper   Pulmonary neg pulmonary ROS,    Pulmonary exam normal breath sounds clear to auscultation       Cardiovascular Exercise Tolerance: Good hypertension, Pt. on medications Normal cardiovascular exam Rhythm:Regular Rate:Normal     Neuro/Psych Anxiety Depression negative neurological ROS     GI/Hepatic Neg liver ROS, IBS   Endo/Other  Obesity  Renal/GU negative Renal ROS  negative genitourinary   Musculoskeletal negative musculoskeletal ROS (+)   Abdominal   Peds  Hematology negative hematology ROS (+)   Anesthesia Other Findings   Reproductive/Obstetrics                            Anesthesia Physical Anesthesia Plan  ASA: II  Anesthesia Plan: General   Post-op Pain Management:    Induction: Intravenous  PONV Risk Score and Plan: 3 and Treatment may vary due to age or medical condition, Ondansetron, Dexamethasone and Midazolam  Airway Management Planned: LMA  Additional Equipment: None  Intra-op Plan:   Post-operative Plan: Extubation in OR  Informed Consent: I have reviewed the patients History and Physical, chart, labs and discussed the procedure including the risks, benefits and alternatives for the proposed anesthesia with the patient or authorized representative who has indicated his/her understanding and acceptance.   Dental advisory given  Plan Discussed with: CRNA and Anesthesiologist  Anesthesia Plan Comments:         Anesthesia Quick Evaluation

## 2017-10-14 NOTE — Discharge Instructions (Signed)
°  Post Anesthesia Home Care Instructions  Activity: Get plenty of rest for the remainder of the day. A responsible individual must stay with you for 24 hours following the procedure.  For the next 24 hours, DO NOT: -Drive a car -Paediatric nurse -Drink alcoholic beverages -Take any medication unless instructed by your physician -Make any legal decisions or sign important papers.  Meals: Start with liquid foods such as gelatin or soup. Progress to regular foods as tolerated. Avoid greasy, spicy, heavy foods. If nausea and/or vomiting occur, drink only clear liquids until the nausea and/or vomiting subsides. Call your physician if vomiting continues.  Special Instructions/Symptoms: Your throat may feel dry or sore from the anesthesia or the breathing tube placed in your throat during surgery. If this causes discomfort, gargle with warm salt water. The discomfort should disappear within 24 hours.  If you had a scopolamine patch placed behind your ear for the management of post- operative nausea and/or vomiting:  1. The medication in the patch is effective for 72 hours, after which it should be removed.  Wrap patch in a tissue and discard in the trash. Wash hands thoroughly with soap and water. 2. You may remove the patch earlier than 72 hours if you experience unpleasant side effects which may include dry mouth, dizziness or visual disturbances. 3. Avoid touching the patch. Wash your hands with soap and water after contact with the patch.   -------------------------  The patient may resume all her previous activities and diet. She will follow-up in my office in one week.  --------------------------   Excuse from Work, School, or Physical Activity _[name]_ needs to be excused from: _x__ Work ____ [employer] ____ Physical activity beginning now and through the following date: _4/10/19__. She may return to work on _4/11/19_______________.  Health Care Provider: _Su Raynelle Bring,  MD__ Date: __4/9/19__ This information is not intended to replace advice given to you by your health care provider. Make sure you discuss any questions you have with your health care provider. Document Released: 12/18/2000 Document Revised: 06/07/2016 Document Reviewed: 01/24/2014 Elsevier Interactive Patient Education  [employer].

## 2017-10-14 NOTE — Anesthesia Procedure Notes (Signed)
Procedure Name: LMA Insertion Performed by: Verita Lamb, CRNA Pre-anesthesia Checklist: Emergency Drugs available, Patient identified, Suction available, Patient being monitored and Timeout performed Patient Re-evaluated:Patient Re-evaluated prior to induction Oxygen Delivery Method: Circle system utilized Preoxygenation: Pre-oxygenation with 100% oxygen Induction Type: IV induction Ventilation: Mask ventilation without difficulty LMA: LMA inserted LMA Size: 4.0 Tube type: Oral Number of attempts: 1 Tube secured with: Tape Dental Injury: Teeth and Oropharynx as per pre-operative assessment

## 2017-10-15 ENCOUNTER — Encounter (HOSPITAL_BASED_OUTPATIENT_CLINIC_OR_DEPARTMENT_OTHER): Payer: Self-pay | Admitting: Otolaryngology

## 2017-10-23 ENCOUNTER — Ambulatory Visit (INDEPENDENT_AMBULATORY_CARE_PROVIDER_SITE_OTHER): Payer: 59 | Admitting: Otolaryngology

## 2018-03-23 ENCOUNTER — Other Ambulatory Visit: Payer: Self-pay | Admitting: Obstetrics and Gynecology

## 2018-03-23 DIAGNOSIS — Z1231 Encounter for screening mammogram for malignant neoplasm of breast: Secondary | ICD-10-CM

## 2018-03-30 ENCOUNTER — Ambulatory Visit (HOSPITAL_COMMUNITY): Payer: 59

## 2018-04-06 ENCOUNTER — Ambulatory Visit (HOSPITAL_COMMUNITY)
Admission: RE | Admit: 2018-04-06 | Discharge: 2018-04-06 | Disposition: A | Discharge status: Home / Self Care | Payer: 59 | Source: Ambulatory Visit | Attending: Obstetrics and Gynecology | Admitting: Obstetrics and Gynecology

## 2018-04-06 DIAGNOSIS — Z1231 Encounter for screening mammogram for malignant neoplasm of breast: Secondary | ICD-10-CM | POA: Insufficient documentation

## 2019-02-25 ENCOUNTER — Telehealth: Payer: Self-pay | Admitting: Gastroenterology

## 2019-02-25 NOTE — Telephone Encounter (Signed)
Pt was just concerned because her liver enzymes were elevated and wondered if she needed to change her diet. She is tolerating food and not having problems with eating. She is aware she should avoid alcohol and said she has not drank any in about 6-8 weeks. She will keep appointment with Neil Crouch, PA on Monday. [name], have we received her labs and if not, can we reach out and make sure we have them for Monday? Thanks!

## 2019-02-25 NOTE — Telephone Encounter (Signed)
Pt called to make OV and is aware of OV on Monday at 8am. She said that her PCP was going to fax her labs over because her liver enzymes were elevated. She wanted to know what she was suppose to do in the meantime. Wanted to know what to eat and to avoid. [phone]

## 2019-02-25 NOTE — Telephone Encounter (Signed)
Noted. Need patient labs prior to OV or will need to be rescheduled.

## 2019-02-25 NOTE — Telephone Encounter (Signed)
[name] [name] is aware.

## 2019-02-25 NOTE — Telephone Encounter (Signed)
Requested labs from PCP as ASAP

## 2019-03-01 ENCOUNTER — Encounter: Payer: Self-pay | Admitting: Gastroenterology

## 2019-03-01 ENCOUNTER — Encounter: Payer: Self-pay | Admitting: *Deleted

## 2019-03-01 ENCOUNTER — Ambulatory Visit: Payer: 59 | Admitting: Gastroenterology

## 2019-03-01 ENCOUNTER — Other Ambulatory Visit: Payer: Self-pay

## 2019-03-01 VITALS — BP 137/81 | HR 72 | Temp 97.1°F | Ht 61.0 in | Wt 169.2 lb

## 2019-03-01 DIAGNOSIS — R945 Abnormal results of liver function studies: Secondary | ICD-10-CM | POA: Diagnosis not present

## 2019-03-01 DIAGNOSIS — K649 Unspecified hemorrhoids: Secondary | ICD-10-CM

## 2019-03-01 DIAGNOSIS — K580 Irritable bowel syndrome with diarrhea: Secondary | ICD-10-CM | POA: Diagnosis not present

## 2019-03-01 DIAGNOSIS — R7989 Other specified abnormal findings of blood chemistry: Secondary | ICD-10-CM

## 2019-03-01 MED ORDER — HYDROCORTISONE (PERIANAL) 2.5 % EX CREA: 1.0000 "application " | TOPICAL_CREAM | Freq: Two times a day (BID) | CUTANEOUS | 0 refills | Status: DC

## 2019-03-01 MED ORDER — DICYCLOMINE HCL 10 MG PO CAPS: ORAL_CAPSULE | ORAL | 2 refills | Status: DC

## 2019-03-01 NOTE — Assessment & Plan Note (Signed)
Anusol cream bid for 2 weeks.

## 2019-03-01 NOTE — Assessment & Plan Note (Signed)
Recent onset elevated transaminases. Only recent medication changes was taking Zinc however numbers increased after discontinuation. Ddx: includes fatty liver, viral hepatitis, hemochromatosis, autoimmune hepatitis. Will check labs. Abdominal u/s.

## 2019-03-01 NOTE — Assessment & Plan Note (Signed)
Diarrhea predominant. Has some lactose intolerance, tries to avoid dairy. Trial of bentyl qac and qhs prn.

## 2019-03-01 NOTE — Progress Notes (Signed)
Primary Care Physician: Jake Samples, PA-C  Primary Gastroenterologist:  Barney Drain, MD   Chief Complaint  Patient presents with  . Abnormal Lab    HPI: [name] [name] is a 58 y.o. female here for further evaluation of abnormal labs at the request of Delman Cheadle, PA-C.  She was last seen by our practice in July 2018.  She has a history of IBS.  She presents today at the request of Delman Cheadle for further evaluation of abnormal LFTs.  Recent routine labs on August 18 showed AST of 80, ALT 53, alkaline phosphatase 82, total bilirubin 0.5, albumin 4.7.  White blood cell count 7500, hemoglobin 14.5, platelets 219,000.  On July 27 her labs showed AST of 49, ALT 40.  In June 2018 her LFTs were normal except for ALT of 33.  Was on Zinc but quit after first labs came back with abnormal LFTS in 01/2019. No other medications changes. Has been on pravastatin for several years. No significant etoh [place], past or present. Her weight is up 10 pounds in the past two years. She denies abdominal pain except for that associated with her IBS. She may not have a BM for several days and then will have diarrhea 7 days in a row. Associated with nausea. Will use imodium occasionally. Some hemorrhoid issues, brbpr on tissue.  No UGI symptoms.   Current Outpatient Medications  Medication Sig Dispense Refill  . cetirizine (ZYRTEC) 10 MG tablet Take 10 mg by mouth daily.    Marland Kitchen PARoxetine (PAXIL) 40 MG tablet Take 40 mg by mouth every morning.     . pravastatin (PRAVACHOL) 40 MG tablet Take 40 mg by mouth daily.     Marland Kitchen triamterene-hydrochlorothiazide (MAXZIDE-25) 37.5-25 MG per tablet Take 1 tablet by mouth daily.      No current facility-administered medications for this visit.     Allergies as of 03/01/2019 - Review Complete 03/01/2019  Allergen Reaction Noted  . Prednisone Swelling 01/11/2013  . Tessalon [benzonatate] Nausea And Vomiting 01/14/2013   Past Medical History:  Diagnosis Date   . Anxiety   . Depression   . Essential hypertension   . Hyperlipidemia   . IBS (irritable bowel syndrome)    Past Surgical History:  Procedure Laterality Date  . COLONOSCOPY N/A 01/18/2013   NL ILEUM, NL COLON Bx  . COLONOSCOPY N/A 02/07/2017   Dr. Oneida Alar: internal/external hemorrhoids. next tcc in 10 years.   . ENDOMETRIAL ABLATION  2011   [facility]  . EXCISION MASS NECK Left 10/14/2017   Procedure: EXCISION LEFT MASS NECK;  Surgeon: Leta Baptist, MD;  Location: [place];  Service: ENT;  Laterality: Left;  . TUBAL LIGATION  2011   [facility]   Family History  Problem Relation Age of Onset  . Hyperlipidemia Mother   . COPD Mother   . COPD Father   . Hyperlipidemia Brother   . Lung cancer Maternal Grandmother   . Hyperlipidemia Brother   . Diabetes Maternal Uncle   . Colon cancer Neg Hx   . Colon polyps Neg Hx    Social History   Tobacco Use  . Smoking status: Never Smoker  . Smokeless tobacco: Never Used  Substance Use Topics  . Alcohol use: Yes    Alcohol/week: 0.0 standard drinks    Comment: Socially  . Drug use: No    ROS:  General: Negative for anorexia, weight loss, fever, chills, fatigue, weakness. ENT: Negative for hoarseness,  difficulty swallowing , nasal congestion. CV: Negative for chest pain, angina, palpitations, dyspnea on exertion, peripheral edema.  Respiratory: Negative for dyspnea at rest, dyspnea on exertion, cough, sputum, wheezing.  GI: See history of present illness. GU:  Negative for dysuria, hematuria, urinary incontinence, urinary frequency, nocturnal urination.  Endo: Negative for unusual weight change.    Physical Examination:   BP 137/81   Pulse 72   Temp (!) 97.1 F (36.2 C) (Oral)   Ht 5\' 1"  (1.549 m)   Wt 169 lb 3.2 oz (76.7 kg)   BMI 31.97 kg/m   General: Well-nourished, well-developed in no acute distress.  Eyes: No icterus. Mouth: Oropharyngeal mucosa moist and pink , no lesions erythema or exudate. Lungs:  Clear to auscultation bilaterally.  Heart: Regular rate and rhythm, no murmurs rubs or gallops.  Abdomen: Bowel sounds are normal, nontender, nondistended, no hepatosplenomegaly or masses, no abdominal bruits or hernia , no rebound or guarding.   Extremities: No lower extremity edema. No clubbing or deformities. Neuro: Alert and oriented x 4   Skin: Warm and dry, no jaundice.   Psych: Alert and cooperative, normal mood and affect.  Labs:  July 2020: Cholesterol 169, HDL 55, LDL 87.  Imaging Studies: No results found.

## 2019-03-01 NOTE — Patient Instructions (Signed)
Please go for your blood work. We will contact you with results as available.   I have sent in dicyclomine (Bentyl) to your pharmacy.  You can take before meals and at bedtime up to 4 times daily to control abdominal cramping and loose stools.  Hold for a day or 2 if you get constipated.  I have sent in Anusol cream for your hemorrhoids.  Please monitor for any side effects or allergies.  If you notice any swelling, rash please stop medication and take Benadryl.  Go for your abdominal ultrasound to evaluate your liver.  We will contact you with results as available.   Nonalcoholic Fatty Liver Disease Diet, Adult Nonalcoholic fatty liver disease is a condition that causes fat to build up in and around the liver. The disease makes it harder for the liver to work the way that it should. Following a healthy diet can help to keep nonalcoholic fatty liver disease under control. It can also help to prevent or improve conditions that are associated with the disease, such as heart disease, diabetes, high blood pressure, and abnormal cholesterol levels. Along with regular exercise, this diet:  Promotes weight loss.  Helps to control blood sugar levels.  Helps to improve the way that the body uses insulin. What are tips for following this plan? Reading food labels Always check food labels for:  The amount of saturated fat in a food. You should limit your intake of saturated fat. Saturated fat is found in foods that come from animals, including meat and dairy products such as butter, cheese, and whole milk.  The amount of fiber in a food. You should choose high-fiber foods such as fruits, vegetables, and whole grains. Try to get 25-30 grams (g) of fiber a day.  Cooking  When cooking, use heart-healthy oils that are high in monounsaturated fats. These include olive oil, canola oil, and avocado oil.  Limit frying or deep-frying foods. Cook foods using healthy methods such as baking, boiling,  steaming, and grilling instead. Meal planning  You may want to keep track of how many calories you take in. Eating the right amount of calories will help you achieve a healthy weight. Meeting with a registered dietitian can help you get started.  Limit how often you eat takeout and fast food. These foods are usually very high in fat, salt, and sugar.  Use the glycemic index (GI) to plan your meals. The index tells you how quickly a food will raise your blood sugar. Choose low-GI foods (GI less than 55). These foods take a longer time to raise blood sugar. A registered dietitian can help you identify foods lower on the GI scale. Lifestyle  You may want to follow a Mediterranean diet. This diet includes a lot of vegetables, lean meats or fish, whole grains, fruits, and healthy oils and fats. What foods can I eat?  Fruits Bananas. Apples. Oranges. Grapes. Papaya. Mango. Pomegranate. Kiwi. Grapefruit. Cherries. Vegetables Lettuce. Spinach. Peas. Beets. Cauliflower. Cabbage. Broccoli. Carrots. Tomatoes. Squash. Eggplant. Herbs. Peppers. Onions. Cucumbers. Brussels sprouts. Yams and sweet potatoes. Beans. Lentils. Grains Whole wheat or whole-grain foods, including breads, crackers, cereals, and pasta. Stone-ground whole wheat. Unsweetened oatmeal. Bulgur. Barley. Quinoa. Brown or wild rice. Corn or whole wheat flour tortillas. Meats and other proteins Lean meats. Poultry. Tofu. Seafood and shellfish. Dairy Low-fat or fat-free dairy products, such as yogurt, cottage cheese, or cheese. Beverages Water. Sugar-free drinks. Tea. Coffee. Low-fat or skim milk. Milk alternatives, such as soy or almond milk.  Real fruit juice. Fats and oils Avocado. Canola or olive oil. Nuts and nut butters. Seeds. Seasonings and condiments Mustard. Relish. Low-fat, low-sugar ketchup and barbecue sauce. Low-fat or fat-free mayonnaise. Sweets and desserts Sugar-free sweets. The items listed above may not be a complete  list of foods and beverages you can eat. Contact a dietitian for more information. What foods should I limit or avoid? Meats and other proteins Limit red meat to 1-2 times a week. Dairy [employer]. Fats and oils Palm oil and coconut oil. Fried foods. Other foods Processed foods. Foods that contain a lot of salt or sodium. Sweets and desserts Sweets that contain sugar. Beverages Sweetened drinks, such as sweet tea, milkshakes, iced sweet drinks, and sodas. Alcohol. The items listed above may not be a complete list of foods and beverages you should avoid. Contact a dietitian for more information. Where to find more information The [employer] of Diabetes and Digestive and Kidney Diseases: [employer] Summary  Nonalcoholic fatty liver disease is a condition that causes fat to build up in and around the liver.  Following a healthy diet can help to keep nonalcoholic fatty liver disease under control. Your diet should be rich in fruits, vegetables, whole grains, and lean proteins.  Limit your intake of saturated fat. Saturated fat is found in foods that come from animals, including meat and dairy products such as butter, cheese, and whole milk.  This diet promotes weight loss, helps to control blood sugar levels, and helps to improve the way that the body uses insulin. This information is not intended to replace advice given to you by your health care provider. Make sure you discuss any questions you have with your health care provider. Document Released: 11/08/2014 Document Revised: 10/16/2018 Document Reviewed: 07/16/2018 Elsevier Patient Education  2020 [employer].  Fatty Liver Disease  Fatty liver disease occurs when too much fat has built up in your liver cells. Fatty liver disease is also called hepatic steatosis or steatohepatitis. The liver removes harmful substances from your bloodstream and produces fluids that your body needs. It also helps your body use and store  energy from the food you eat. In many cases, fatty liver disease does not cause symptoms or problems. It is often diagnosed when tests are being done for other reasons. However, over time, fatty liver can cause inflammation that may lead to more serious liver problems, such as scarring of the liver (cirrhosis) and liver failure. Fatty liver is associated with insulin resistance, increased body fat, high blood pressure (hypertension), and high cholesterol. These are features of metabolic syndrome and increase your risk for stroke, diabetes, and heart disease. What are the causes? This condition may be caused by:  Drinking too much alcohol.  Poor nutrition.  Obesity.  Cushing's syndrome.  Diabetes.  High cholesterol.  Certain drugs.  Poisons.  Some viral infections.  Pregnancy. What increases the risk? You are more likely to develop this condition if you:  Abuse alcohol.  Are overweight.  Have diabetes.  Have hepatitis.  Have a high triglyceride level.  Are pregnant. What are the signs or symptoms? Fatty liver disease often does not cause symptoms. If symptoms do develop, they can include:  Fatigue.  Weakness.  Weight loss.  Confusion.  Abdominal pain.  Nausea and vomiting.  Yellowing of your skin and the white parts of your eyes (jaundice).  Itchy skin. How is this diagnosed? This condition may be diagnosed by:  A physical exam and medical history.  Blood tests.  Imaging tests, such as an ultrasound, CT scan, or MRI.  A liver biopsy. A small sample of liver tissue is removed using a needle. The sample is then looked at under a microscope. How is this treated? Fatty liver disease is often caused by other health conditions. Treatment for fatty liver may involve medicines and lifestyle changes to manage conditions such as:  Alcoholism.  High cholesterol.  Diabetes.  Being overweight or obese. Follow these instructions at home:   Do not drink  alcohol. If you have trouble quitting, ask your health care provider how to safely quit with the help of medicine or a supervised program. This is important to keep your condition from getting worse.  Eat a healthy diet as told by your health care provider. Ask your health care provider about working with a diet and nutrition specialist (dietitian) to develop an eating plan.  Exercise regularly. This can help you lose weight and control your cholesterol and diabetes. Talk to your health care provider about an exercise plan and which activities are best for you.  Take over-the-counter and prescription medicines only as told by your health care provider.  Keep all follow-up visits as told by your health care provider. This is important. Contact a health care provider if: You have trouble controlling your:  Blood sugar. This is especially important if you have diabetes.  Cholesterol.  Drinking of alcohol. Get help right away if:  You have abdominal pain.  You have jaundice.  You have nausea and vomiting.  You vomit blood or material that looks like coffee grounds.  You have stools that are black, tar-like, or bloody. Summary  Fatty liver disease develops when too much fat builds up in the cells of your liver.  Fatty liver disease often causes no symptoms or problems. However, over time, fatty liver can cause inflammation that may lead to more serious liver problems, such as scarring of the liver (cirrhosis).  You are more likely to develop this condition if you abuse alcohol, are pregnant, are overweight, have diabetes, have hepatitis, or have high triglyceride levels.  Contact your health care provider if you have trouble controlling your weight, blood sugar, cholesterol, or drinking of alcohol. This information is not intended to replace advice given to you by your health care provider. Make sure you discuss any questions you have with your health care provider. Document  Released: 08/09/2005 Document Revised: 06/06/2017 Document Reviewed: 04/02/2017 Elsevier Patient Education  2020 [employer].

## 2019-03-02 LAB — MITOCHONDRIAL/SMOOTH MUSCLE AB PNL
Mitochondrial Ab: <20 Units (ref 0.0–20.0)
Smooth Muscle Ab: 5 Units (ref 0–19)

## 2019-03-02 LAB — HEPATITIS B CORE ANTIBODY, TOTAL: Hep B Core Total Ab: NEGATIVE

## 2019-03-02 LAB — HEPATITIS C ANTIBODY: Hep C Virus Ab: <0.1 s/co ratio (ref 0.0–0.9)

## 2019-03-02 LAB — HEPATITIS A ANTIBODY, TOTAL: hep A Total Ab: NEGATIVE

## 2019-03-02 LAB — IRON,TIBC AND FERRITIN PANEL
Ferritin: 326 ng/mL — ABNORMAL HIGH (ref 15–150)
Iron Saturation: 33 % (ref 15–55)
Iron: 100 ug/dL (ref 27–159)
Total Iron Binding Capacity: 307 ug/dL (ref 250–450)
UIBC: 207 ug/dL (ref 131–425)

## 2019-03-02 LAB — IGG, IGA, IGM
IgA/Immunoglobulin A, Serum: 147 mg/dL (ref 87–352)
IgG (Immunoglobin G), Serum: 947 mg/dL (ref 586–1602)
IgM (Immunoglobulin M), Srm: 61 mg/dL (ref 26–217)

## 2019-03-02 LAB — HEPATITIS B SURFACE ANTIGEN: Hepatitis B Surface Ag: NEGATIVE

## 2019-03-02 LAB — HEPATITIS B SURFACE ANTIBODY,QUALITATIVE: Hep B Surface Ab, Qual: NONREACTIVE

## 2019-03-02 LAB — ANA: Anti Nuclear Antibody (ANA): NEGATIVE

## 2019-03-05 ENCOUNTER — Other Ambulatory Visit: Payer: Self-pay

## 2019-03-05 ENCOUNTER — Ambulatory Visit (HOSPITAL_COMMUNITY)
Admission: RE | Admit: 2019-03-05 | Discharge: 2019-03-05 | Disposition: A | Discharge status: Home / Self Care | Payer: 59 | Source: Ambulatory Visit | Attending: Gastroenterology | Admitting: Gastroenterology

## 2019-03-05 DIAGNOSIS — R7989 Other specified abnormal findings of blood chemistry: Secondary | ICD-10-CM

## 2019-03-05 DIAGNOSIS — R945 Abnormal results of liver function studies: Secondary | ICD-10-CM | POA: Diagnosis present

## 2019-03-05 DIAGNOSIS — K649 Unspecified hemorrhoids: Secondary | ICD-10-CM | POA: Insufficient documentation

## 2019-03-05 DIAGNOSIS — K580 Irritable bowel syndrome with diarrhea: Secondary | ICD-10-CM | POA: Insufficient documentation

## 2019-03-09 ENCOUNTER — Telehealth: Payer: Self-pay | Admitting: Gastroenterology

## 2019-03-09 NOTE — Telephone Encounter (Signed)
Pt is aware we usually call within 7-10 business days. I am forwarding to Neil Crouch, PA to advise!

## 2019-03-09 NOTE — Telephone Encounter (Signed)
[phone] patient inquiring about ultrasound results

## 2019-03-10 ENCOUNTER — Other Ambulatory Visit: Payer: Self-pay

## 2019-03-10 ENCOUNTER — Encounter: Payer: Self-pay | Admitting: Gastroenterology

## 2019-03-10 DIAGNOSIS — K760 Fatty (change of) liver, not elsewhere classified: Secondary | ICD-10-CM

## 2019-03-10 NOTE — Telephone Encounter (Signed)
PT is aware of results.  

## 2019-03-10 NOTE — Progress Notes (Signed)
PATIENT SCHEDULED AND LETTER SENT  °

## 2019-03-10 NOTE — Progress Notes (Signed)
hepa

## 2019-03-10 NOTE — Telephone Encounter (Signed)
SEE RESULT NOTE 

## 2019-04-05 ENCOUNTER — Other Ambulatory Visit: Payer: Self-pay

## 2019-04-05 DIAGNOSIS — K760 Fatty (change of) liver, not elsewhere classified: Secondary | ICD-10-CM

## 2019-05-06 LAB — HEPATIC FUNCTION PANEL
ALT: 35 IU/L — ABNORMAL HIGH (ref 0–32)
AST: 37 IU/L (ref 0–40)
Albumin: 4.5 g/dL (ref 3.8–4.9)
Alkaline Phosphatase: 85 IU/L (ref 39–117)
Bilirubin Total: 0.3 mg/dL (ref 0.0–1.2)
Bilirubin, Direct: 0.1 mg/dL (ref 0.00–0.40)
Total Protein: 6.7 g/dL (ref 6.0–8.5)

## 2019-05-10 ENCOUNTER — Other Ambulatory Visit: Payer: Self-pay

## 2019-05-10 DIAGNOSIS — R945 Abnormal results of liver function studies: Secondary | ICD-10-CM

## 2019-05-10 DIAGNOSIS — R7989 Other specified abnormal findings of blood chemistry: Secondary | ICD-10-CM

## 2019-05-12 ENCOUNTER — Telehealth: Payer: Self-pay | Admitting: Gastroenterology

## 2019-05-12 NOTE — Telephone Encounter (Signed)
Spoke with pt. Pt wanted to discuss her lab results that were seen in mychart. Labs were normal. Pt was notified of results.

## 2019-05-12 NOTE — Telephone Encounter (Signed)
WORK [phone] OR CELL [phone] PLEASE CALL PATIENT, SHE HAS SOME QUESTIONS ABOUT HER BLOOD WORK

## 2019-05-19 ENCOUNTER — Other Ambulatory Visit (HOSPITAL_COMMUNITY): Payer: Self-pay | Admitting: Family Medicine

## 2019-05-19 DIAGNOSIS — Z1231 Encounter for screening mammogram for malignant neoplasm of breast: Secondary | ICD-10-CM

## 2019-05-31 ENCOUNTER — Other Ambulatory Visit: Payer: Self-pay

## 2019-05-31 ENCOUNTER — Ambulatory Visit (HOSPITAL_COMMUNITY)
Admission: RE | Admit: 2019-05-31 | Discharge: 2019-05-31 | Disposition: A | Discharge status: Home / Self Care | Payer: 59 | Source: Ambulatory Visit | Attending: Family Medicine | Admitting: Family Medicine

## 2019-05-31 DIAGNOSIS — Z1231 Encounter for screening mammogram for malignant neoplasm of breast: Secondary | ICD-10-CM | POA: Diagnosis present

## 2019-07-13 ENCOUNTER — Other Ambulatory Visit: Payer: Self-pay

## 2019-07-13 ENCOUNTER — Encounter: Payer: Self-pay | Admitting: Gastroenterology

## 2019-07-13 ENCOUNTER — Ambulatory Visit: Payer: 59 | Admitting: Gastroenterology

## 2019-07-13 VITALS — BP 132/88 | HR 84 | Temp 97.8°F | Ht 61.0 in | Wt 165.4 lb

## 2019-07-13 DIAGNOSIS — K760 Fatty (change of) liver, not elsewhere classified: Secondary | ICD-10-CM | POA: Diagnosis not present

## 2019-07-13 DIAGNOSIS — K582 Mixed irritable bowel syndrome: Secondary | ICD-10-CM

## 2019-07-13 NOTE — Progress Notes (Signed)
Cc'ed to pcp °

## 2019-07-13 NOTE — Progress Notes (Signed)
Referring Provider: Jake Samples, PA* Primary Care Physician:  Jake Samples, PA-C Primary GI: Dr. Oneida Alar   Chief Complaint  Patient presents with  . change in bowels    2-3 daily, sometimes can skip a day, also depends on what she eats    HPI:   [name] [name] is a 59 y.o. female presenting today with a history of elevated transaminases in setting of fatty liver ([place] Aug 2020). Extensive serologies including autoimmune, iron studies, viral hepatitis, immunoglobulins. Ferritin elevated at 326 but iron sats 33. Not immune to Hep A or B. Oct 2020 with mildly elevated ALT at 35 and AST 37. Repeat HFP and ferritin due around April 2021.   Changed diet. Increased exercise. Using exercise bike.   History of IBS: provided Bentyl at last visit. Will skip several days of BMs then have large amount. Bentyl as needed usually every other day, usually BID, because concerned about possible abdominal cramping. If taking BID will skip BMs the next day.  Taking once each evening on average regardless. Has abdominal cramping as predominant symptoms, which Bentyl helps. Alternating loose and formed.   Purposeful weight loss of 5 lbs since last visit.   Past Medical History:  Diagnosis Date  . Anxiety   . Depression   . Essential hypertension   . Hyperlipidemia   . IBS (irritable bowel syndrome)     Past Surgical History:  Procedure Laterality Date  . COLONOSCOPY N/A 01/18/2013   NL ILEUM, NL COLON Bx  . COLONOSCOPY N/A 02/07/2017   Dr. Oneida Alar: internal/external hemorrhoids. next tcc in 10 years.   . ENDOMETRIAL ABLATION  2011   [facility]  . EXCISION MASS NECK Left 10/14/2017   Procedure: EXCISION LEFT MASS NECK;  Surgeon: Leta Baptist, MD;  Location: [place];  Service: ENT;  Laterality: Left;  . TUBAL LIGATION  2011   [facility]    Current Outpatient Medications  Medication Sig Dispense Refill  . cetirizine (ZYRTEC) 10 MG tablet Take 10 mg by mouth daily.    Marland Kitchen  dicyclomine (BENTYL) 10 MG capsule Take one capsule before meals and at bedtime AS NEEDED for abdominal cramps and diarrhea. HOLD for constipation. 120 capsule 2  . hydrocortisone (ANUSOL-HC) 2.5 % rectal cream Place 1 application rectally 2 (two) times daily. (Patient taking differently: Place 1 application rectally as needed. ) 30 g 0  . indomethacin (INDOCIN) 50 MG capsule as needed.    Marland Kitchen PARoxetine (PAXIL) 40 MG tablet Take 40 mg by mouth every morning.     . pravastatin (PRAVACHOL) 40 MG tablet Take 40 mg by mouth daily.     Marland Kitchen triamterene-hydrochlorothiazide (MAXZIDE-25) 37.5-25 MG per tablet Take 1 tablet by mouth daily.      No current facility-administered medications for this visit.    Allergies as of 07/13/2019 - Review Complete 07/13/2019  Allergen Reaction Noted  . Prednisone Swelling 01/11/2013  . Tessalon [benzonatate] Nausea And Vomiting 01/14/2013    Family History  Problem Relation Age of Onset  . Hyperlipidemia Mother   . COPD Mother   . COPD Father   . Hyperlipidemia Brother   . Lung cancer Maternal Grandmother   . Hyperlipidemia Brother   . Diabetes Maternal Uncle   . Colon cancer Neg Hx   . Colon polyps Neg Hx     Social History   Socioeconomic History  . Marital status: Widowed    Spouse name: Not on file  . Number of  children: Not on file  . Years of education: Not on file  . Highest education level: Not on file  Occupational History  . Occupation: Employed    Fish farm manager: [employer]    Comment: [employer]   Tobacco Use  . Smoking status: Never Smoker  . Smokeless tobacco: Never Used  Substance and Sexual Activity  . Alcohol use: Yes    Alcohol/week: 0.0 standard drinks    Comment: Socially  . Drug use: No  . Sexual activity: Not Currently    Birth control/protection: Surgical  Other Topics Concern  . Not on file  Social History Narrative  . Not on file   Social Determinants of Health   Financial Resource Strain:   . Difficulty of Paying  Living Expenses: Not on file  Food Insecurity:   . Worried About Charity fundraiser in the Last Year: Not on file  . Ran Out of Food in the Last Year: Not on file  Transportation [name]:   . Lack of Transportation (Medical): Not on file  . Lack of Transportation (Non-Medical): Not on file  Physical Activity:   . Days of Exercise per Week: Not on file  . Minutes of Exercise per Session: Not on file  Stress:   . Feeling of Stress : Not on file  Social Connections:   . Frequency of Communication with Friends and Family: Not on file  . Frequency of Social Gatherings with Friends and Family: Not on file  . Attends Religious Services: Not on file  . Active Member of Clubs or Organizations: Not on file  . Attends Archivist Meetings: Not on file  . Marital Status: Not on file    Review of Systems: Gen: Denies fever, chills, anorexia. Denies fatigue, weakness, weight loss.  CV: Denies chest pain, palpitations, syncope, peripheral edema, and claudication. Resp: Denies dyspnea at rest, cough, wheezing, coughing up blood, and pleurisy. GI:see HPI Derm: Denies rash, itching, dry skin Psych: Denies depression, anxiety, memory loss, confusion. No homicidal or suicidal ideation.  Heme: Denies bruising, bleeding, and enlarged lymph nodes.  Physical Exam: BP 132/88   Pulse 84   Temp 97.8 F (36.6 C) (Oral)   Ht 5\' 1"  (1.549 m)   Wt 165 lb 6.4 oz (75 kg)   BMI 31.25 kg/m  General:   Alert and oriented. No distress noted. Pleasant and cooperative.  Head:  Normocephalic and atraumatic. Abdomen:  +BS, soft, non-tender and non-distended. No rebound or guarding. No HSM or masses noted. Msk:  Symmetrical without gross deformities. Normal posture. Extremities:  Without edema. Neurologic:  Alert and  oriented x4 Psych:  Alert and cooperative. Normal mood and affect.  ASSESSMENT: [name] [name] is a 59 y.o. female presenting today with a history of fatty liver, thorough serologies on  file with just minimally elevated ferritin and normal sats, with improvement to almost near normalization of transaminases through diet and exercise changes. 5 lbs purposeful weight loss noted and applauded. Repeat HFP and ferritin due in April 2021.  IBS: trending towards constipation with Bentyl. Will decrease dosage to just once per day, as she notes constipation if taking BID. May need to trial a different agent (Levsin).    PLAN:  Repeat HFP and ferritin in April 2021 Continue diet/exercise changes Hep A/B vaccination recommended Limit Bentyl to once per day and call with progress report Return in 6 months   [name] Needs, PhD, [facility] Gastroenterology

## 2019-07-13 NOTE — Patient Instructions (Addendum)
Keep up the good work with diet changes and exercise!  Let's try Bentyl just once daily. We are trying to avoid constipation. We may need to try a different medication. If you ever have more than 1 day without a bowel movement, you can take a capful of Miralax.  Please call us with an update next week!  You are not immune to Hepatitis A or B. It is important to have these vaccinations completed. You can take this to your primary care to get this arranged.    We will send you blood work to have done around April 2021, and we will see you in 6 months!  It was a pleasure to see you today. I want to create trusting relationships with patients to provide genuine, compassionate, and quality care. I value your feedback. If you receive a survey regarding your visit,  I greatly appreciate you taking time to fill this out.   [name], PhD, ANP-BC [facility] Gastroenterology    Fatty Liver Disease  Fatty liver disease occurs when too much fat has built up in your liver cells. Fatty liver disease is also called hepatic steatosis or steatohepatitis. The liver removes harmful substances from your bloodstream and produces fluids that your body needs. It also helps your body use and store energy from the food you eat. In many cases, fatty liver disease does not cause symptoms or problems. It is often diagnosed when tests are being done for other reasons. However, over time, fatty liver can cause inflammation that may lead to more serious liver problems, such as scarring of the liver (cirrhosis) and liver failure. Fatty liver is associated with insulin resistance, increased body fat, high blood pressure (hypertension), and high cholesterol. These are features of metabolic syndrome and increase your risk for stroke, diabetes, and heart disease. What are the causes? This condition may be caused by:  Drinking too much alcohol.  Poor nutrition.  Obesity.  Cushing's syndrome.  Diabetes.  High  cholesterol.  Certain drugs.  Poisons.  Some viral infections.  Pregnancy. What increases the risk? You are more likely to develop this condition if you:  Abuse alcohol.  Are overweight.  Have diabetes.  Have hepatitis.  Have a high triglyceride level.  Are pregnant. What are the signs or symptoms? Fatty liver disease often does not cause symptoms. If symptoms do develop, they can include:  Fatigue.  Weakness.  Weight loss.  Confusion.  Abdominal pain.  Nausea and vomiting.  Yellowing of your skin and the white parts of your eyes (jaundice).  Itchy skin. How is this diagnosed? This condition may be diagnosed by:  A physical exam and medical history.  Blood tests.  Imaging tests, such as an ultrasound, CT scan, or MRI.  A liver biopsy. A small sample of liver tissue is removed using a needle. The sample is then looked at under a microscope. How is this treated? Fatty liver disease is often caused by other health conditions. Treatment for fatty liver may involve medicines and lifestyle changes to manage conditions such as:  Alcoholism.  High cholesterol.  Diabetes.  Being overweight or obese. Follow these instructions at home:   Do not drink alcohol. If you have trouble quitting, ask your health care provider how to safely quit with the help of medicine or a supervised program. This is important to keep your condition from getting worse.  Eat a healthy diet as told by your health care provider. Ask your health care provider about working with  a diet and nutrition specialist (dietitian) to develop an eating plan.  Exercise regularly. This can help you lose weight and control your cholesterol and diabetes. Talk to your health care provider about an exercise plan and which activities are best for you.  Take over-the-counter and prescription medicines only as told by your health care provider.  Keep all follow-up visits as told by your health care  provider. This is important. Contact a health care provider if: You have trouble controlling your:  Blood sugar. This is especially important if you have diabetes.  Cholesterol.  Drinking of alcohol. Get help right away if:  You have abdominal pain.  You have jaundice.  You have nausea and vomiting.  You vomit blood or material that looks like coffee grounds.  You have stools that are black, tar-like, or bloody. Summary  Fatty liver disease develops when too much fat builds up in the cells of your liver.  Fatty liver disease often causes no symptoms or problems. However, over time, fatty liver can cause inflammation that may lead to more serious liver problems, such as scarring of the liver (cirrhosis).  You are more likely to develop this condition if you abuse alcohol, are pregnant, are overweight, have diabetes, have hepatitis, or have high triglyceride levels.  Contact your health care provider if you have trouble controlling your weight, blood sugar, cholesterol, or drinking of alcohol. This information is not intended to replace advice given to you by your health care provider. Make sure you discuss any questions you have with your health care provider. Document Revised: 06/06/2017 Document Reviewed: 04/02/2017 Elsevier Patient Education  2020 [employer].

## 2019-09-23 ENCOUNTER — Other Ambulatory Visit (HOSPITAL_COMMUNITY)
Admission: RE | Admit: 2019-09-23 | Discharge: 2019-09-23 | Disposition: A | Discharge status: Home / Self Care | Payer: 59 | Source: Ambulatory Visit | Attending: Obstetrics and Gynecology | Admitting: Obstetrics and Gynecology

## 2019-09-23 ENCOUNTER — Other Ambulatory Visit: Payer: Self-pay

## 2019-09-23 ENCOUNTER — Encounter: Payer: Self-pay | Admitting: Obstetrics and Gynecology

## 2019-09-23 ENCOUNTER — Ambulatory Visit (INDEPENDENT_AMBULATORY_CARE_PROVIDER_SITE_OTHER): Payer: 59 | Admitting: Obstetrics and Gynecology

## 2019-09-23 VITALS — BP 126/67 | HR 60 | Ht 61.0 in | Wt 167.8 lb

## 2019-09-23 DIAGNOSIS — Z01419 Encounter for gynecological examination (general) (routine) without abnormal findings: Secondary | ICD-10-CM | POA: Diagnosis not present

## 2019-09-23 DIAGNOSIS — Z1151 Encounter for screening for human papillomavirus (HPV): Secondary | ICD-10-CM

## 2019-09-23 NOTE — Patient Instructions (Signed)
  Ms. Hanford , Thank you for taking time to come for your Medicare Wellness Visit. I appreciate your ongoing commitment to your health goals. Please review the following plan we discussed and let me know if I can assist you in the future.   These are the goals we discussed: Goals   None     This is a list of the screening recommended for you and due dates:  Health Maintenance  Topic Date Due  . HIV Screening  Never done  . Tetanus Vaccine  Never done  . Flu Shot  Never done  . Pap Smear  12/19/2019  . Mammogram  05/30/2021  . Colon Cancer Screening  02/08/2027  .  Hepatitis C: One time screening is recommended by Center for Disease Control  (CDC) for  adults born from [birth year] through 1964.   Completed

## 2019-09-23 NOTE — Progress Notes (Signed)
Assessment:  Annual Gyn Exam Fatty Liver followed by GI next labs april Plan:  1. pap smear done, next pap due 5 yr 2. return annually or prn 3    Annual mammogram advised after age 58 Subjective:  [name] [name] is a 59 y.o. female G3P3 who presents for annual exam. No LMP recorded. Patient has had an ablation. The patient has complaints today of occas hemorrhoids  The following portions of the patient's history were reviewed and updated as appropriate: allergies, current medications, past family history, past medical history, past social history, past surgical history and problem list. Past Medical History:  Diagnosis Date  . Anxiety   . Depression   . Essential hypertension   . Hyperlipidemia   . IBS (irritable bowel syndrome)   Asherman syndrome  Postmenopausal bleeding 2016 IBS-C  Past Surgical History:  Procedure Laterality Date  . COLONOSCOPY N/A 01/18/2013   NL ILEUM, NL COLON Bx  . COLONOSCOPY N/A 02/07/2017   Dr. Oneida Alar: internal/external hemorrhoids. next tcc in 10 years.   . ENDOMETRIAL ABLATION  2011   [facility]  . EXCISION MASS NECK Left 10/14/2017   Procedure: EXCISION LEFT MASS NECK;  Surgeon: Leta Baptist, MD;  Location: [facility];  Service: ENT;  Laterality: Left;  . TUBAL LIGATION  2011   [facility]     Current Outpatient Medications:  .  cetirizine (ZYRTEC) 10 MG tablet, Take 10 mg by mouth daily., Disp: , Rfl:  .  dicyclomine (BENTYL) 10 MG capsule, Take one capsule before meals and at bedtime AS NEEDED for abdominal cramps and diarrhea. HOLD for constipation., Disp: 120 capsule, Rfl: 2 .  hydrocortisone (ANUSOL-HC) 2.5 % rectal cream, Place 1 application rectally 2 (two) times daily. (Patient taking differently: Place 1 application rectally as needed. ), Disp: 30 g, Rfl: 0 .  indomethacin (INDOCIN) 50 MG capsule, as needed., Disp: , Rfl:  .  PARoxetine (PAXIL) 40 MG tablet, Take 40 mg by mouth every morning. , Disp: , Rfl:  .  pravastatin  (PRAVACHOL) 40 MG tablet, Take 40 mg by mouth daily. , Disp: , Rfl:  .  triamterene-hydrochlorothiazide (MAXZIDE-25) 37.5-25 MG per tablet, Take 1 tablet by mouth daily. , Disp: , Rfl:   Review of Systems Constitutional: negative, weight stable, hoping to lose Gastrointestinal: negative Genitourinary: no PMB  Objective:  There were no vitals taken for this visit.   BMI: There is no height or weight on file to calculate BMI.  General Appearance: Alert, appropriate appearance for age. No acute distress HEENT: Grossly normal Neck / Thyroid:  Cardiovascular: RRR; normal S1, S2, no murmur Lungs: CTA bilaterally Back: No CVAT Breast Exam: No dimpling, nipple retraction or discharge. No masses or nodes., Normal to inspection, Normal breast tissue bilaterally and No masses or nodes.No dimpling, nipple retraction or discharge. Gastrointestinal: Soft, non-tender, no masses or organomegaly Pelvic Exam: Vulva and vagina appear normal. Bimanual exam reveals normal uterus and adnexa. Rectovaginal: normal rectal, no masses and guaiac negative stool obtained Lymphatic Exam: Non-palpable nodes in neck, clavicular, axillary, or inguinal regions Skin: no rash or abnormalities Neurologic: Normal gait and speech, no tremor  Psychiatric: Alert and oriented, appropriate affect. CBC Latest Ref Rng & Units 12/18/2016 04/23/2010 03/30/2010  WBC 3.4 - 10.8 x10E3/uL 5.7 8.0 7.8  Hemoglobin 11.1 - 15.9 g/dL 15.2 11.4(L) 11.6(L)  Hematocrit 34.0 - 46.6 % 44.9 33.2(L) 33.4(L)  Platelets 150 - 379 x10E3/uL 258 249 203   CMP Latest Ref Rng & Units  05/05/2019 10/09/2017 12/18/2016  Glucose 65 - 99 mg/dL - 124(H) 63(L)  BUN 6 - 20 mg/dL - 19 16  Creatinine 0.44 - 1.00 mg/dL - 0.85 0.82  Sodium 135 - 145 mmol/L - 138 142  Potassium 3.5 - 5.1 mmol/L - 4.2 4.1  Chloride 101 - 111 mmol/L - 101 101  CO2 22 - 32 mmol/L - 27 26  Calcium 8.9 - 10.3 mg/dL - 9.2 9.6  Total Protein 6.0 - 8.5 g/dL 6.7 - 7.5  Total Bilirubin 0.0  - 1.2 mg/dL 0.3 - 0.2  Alkaline Phos 39 - 117 IU/L 85 - 87  AST 0 - 40 IU/L 37 - 36  ALT 0 - 32 IU/L 35(H) - 33(H)  Hepatitis C neg 2018.2020    Urinalysis:normal and Not done  Mallory Shirk. MD Pgr [phone] 8:23 AM

## 2019-09-24 LAB — CYTOLOGY - PAP
Chlamydia: NEGATIVE
Comment: NEGATIVE
Comment: NEGATIVE
Comment: NORMAL
Diagnosis: NEGATIVE
High risk HPV: NEGATIVE
Neisseria Gonorrhea: NEGATIVE

## 2019-10-11 ENCOUNTER — Other Ambulatory Visit: Payer: Self-pay

## 2019-10-11 DIAGNOSIS — R7989 Other specified abnormal findings of blood chemistry: Secondary | ICD-10-CM

## 2019-10-11 DIAGNOSIS — R945 Abnormal results of liver function studies: Secondary | ICD-10-CM

## 2019-11-09 LAB — HEPATIC FUNCTION PANEL
ALT: 38 IU/L — ABNORMAL HIGH (ref 0–32)
AST: 31 IU/L (ref 0–40)
Albumin: 4.5 g/dL (ref 3.8–4.9)
Alkaline Phosphatase: 87 IU/L (ref 39–117)
Bilirubin Total: 0.3 mg/dL (ref 0.0–1.2)
Bilirubin, Direct: 0.1 mg/dL (ref 0.00–0.40)
Total Protein: 6.8 g/dL (ref 6.0–8.5)

## 2019-11-09 LAB — FERRITIN: Ferritin: 259 ng/mL — ABNORMAL HIGH (ref 15–150)

## 2020-01-11 ENCOUNTER — Other Ambulatory Visit: Payer: Self-pay

## 2020-01-11 ENCOUNTER — Ambulatory Visit: Payer: 59 | Admitting: Gastroenterology

## 2020-01-11 ENCOUNTER — Encounter: Payer: Self-pay | Admitting: Gastroenterology

## 2020-01-11 VITALS — BP 128/81 | HR 75 | Temp 97.1°F | Ht 61.0 in | Wt 167.8 lb

## 2020-01-11 DIAGNOSIS — K760 Fatty (change of) liver, not elsewhere classified: Secondary | ICD-10-CM | POA: Diagnosis not present

## 2020-01-11 DIAGNOSIS — K580 Irritable bowel syndrome with diarrhea: Secondary | ICD-10-CM

## 2020-01-11 MED ORDER — RIFAXIMIN 550 MG PO TABS: 550.0000 mg | ORAL_TABLET | Freq: Three times a day (TID) | ORAL | 0 refills | Status: AC

## 2020-01-11 NOTE — Progress Notes (Signed)
Referring Provider: Jake Samples, PA* Primary Care Physician:  Jake Samples, PA-C  Primary GI: previously Dr. Oneida Alar   Chief Complaint  Patient presents with  . Irritable Bowel Syndrome    c/o increase diarrhea, not so much pain    HPI:   [name] [name] is a 59 y.o. female presenting today with a history of  elevated transaminases in setting of fatty liver ([place] Aug 2020). Extensive serologies including autoimmune, iron studies, viral hepatitis, immunoglobulins. Ferritin elevated at 326 but iron sats less than 40 in the past. Not immune to Hep A or B. Hep A/B vaccinations provided at last visit.  Most recent LFTs with ALT just minimally elevated at 38, AST remaining normal. Ferritin decreased to 259. Plans for repeating ferritin and sats in Nov 2021. History of IBS with diarrhea predominant.   Bentyl has helped in the past for IBS-D. Some things she eats will cause increased stools. Red meat will cause urgent stool, which is sometimes normal, sometimes watery. BM on a good day will be about once. Bad days more frequent. Taking Bentyl BID. Will take Bentyl if more frequent. Took Imodium the other day. No rectal bleeding. Occasional cramping. Drinking lactose-free milk. Thinks ice cream will trigger her but eats this once a week. Loves cheese.   Past Medical History:  Diagnosis Date  . Anxiety   . Depression   . Essential hypertension   . Hyperlipidemia   . IBS (irritable bowel syndrome)     Past Surgical History:  Procedure Laterality Date  . COLONOSCOPY N/A 01/18/2013   NL ILEUM, NL COLON Bx  . COLONOSCOPY N/A 02/07/2017   Dr. Oneida Alar: internal/external hemorrhoids. next tcc in 10 years.   . ENDOMETRIAL ABLATION  2011   [facility]  . EXCISION MASS NECK Left 10/14/2017   Procedure: EXCISION LEFT MASS NECK;  Surgeon: Leta Baptist, MD;  Location: [place];  Service: ENT;  Laterality: Left;  . TUBAL LIGATION  2011   [facility]    Current Outpatient  Medications  Medication Sig Dispense Refill  . cetirizine (ZYRTEC) 10 MG tablet Take 10 mg by mouth daily.    Marland Kitchen dicyclomine (BENTYL) 10 MG capsule Take one capsule before meals and at bedtime AS NEEDED for abdominal cramps and diarrhea. HOLD for constipation. 120 capsule 2  . hydrocortisone (ANUSOL-HC) 2.5 % rectal cream Place 1 application rectally 2 (two) times daily. (Patient taking differently: Place 1 application rectally as needed. ) 30 g 0  . losartan-hydrochlorothiazide (HYZAAR) 50-12.5 MG tablet Take 1 tablet by mouth daily.    Marland Kitchen PARoxetine (PAXIL) 40 MG tablet Take 40 mg by mouth every morning.     . pravastatin (PRAVACHOL) 40 MG tablet Take 40 mg by mouth daily.     . VENTOLIN HFA 108 (90 Base) MCG/ACT inhaler SMARTSIG:1-2 Puff(s) By Mouth Every 4 Hours PRN     No current facility-administered medications for this visit.    Allergies as of 01/11/2020 - Review Complete 01/11/2020  Allergen Reaction Noted  . Prednisone Swelling 01/11/2013  . Tessalon [benzonatate] Nausea And Vomiting 01/14/2013    Family History  Problem Relation Age of Onset  . Hyperlipidemia Mother   . COPD Mother   . COPD Father   . Hyperlipidemia Brother   . Lung cancer Maternal Grandmother   . Hyperlipidemia Brother   . Diabetes Maternal Uncle   . Colon cancer Neg Hx   . Colon polyps Neg Hx  Social History   Socioeconomic History  . Marital status: Widowed    Spouse name: Not on file  . Number of children: Not on file  . Years of education: Not on file  . Highest education level: Not on file  Occupational History  . Occupation: Employed    Fish farm manager: [employer]    Comment: [employer]   Tobacco Use  . Smoking status: Never Smoker  . Smokeless tobacco: Never Used  Vaping Use  . Vaping Use: Never used  Substance and Sexual Activity  . Alcohol use: Yes    Alcohol/week: 0.0 standard drinks    Comment: Socially  . Drug use: No  . Sexual activity: Yes    Birth control/protection:  Surgical, None  Other Topics Concern  . Not on file  Social History Narrative  . Not on file   Social Determinants of Health   Financial Resource Strain:   . Difficulty of Paying Living Expenses:   Food Insecurity:   . Worried About Charity fundraiser in the Last Year:   . Arboriculturist in the Last Year:   Transportation Needs:   . Film/video editor (Medical):   Marland Kitchen Lack of Transportation (Non-Medical):   Physical Activity:   . Days of Exercise per Week:   . Minutes of Exercise per Session:   Stress:   . Feeling of Stress :   Social Connections:   . Frequency of Communication with Friends and Family:   . Frequency of Social Gatherings with Friends and Family:   . Attends Religious Services:   . Active Member of Clubs or Organizations:   . Attends Archivist Meetings:   Marland Kitchen Marital Status:     Review of Systems: Gen: Denies fever, chills, anorexia. Denies fatigue, weakness, weight loss.  CV: Denies chest pain, palpitations, syncope, peripheral edema, and claudication. Resp: Denies dyspnea at rest, cough, wheezing, coughing up blood, and pleurisy. GI: see HPI Derm: Denies rash, itching, dry skin Psych: Denies depression, anxiety, memory loss, confusion. No homicidal or suicidal ideation.  Heme: Denies bruising, bleeding, and enlarged lymph nodes.  Physical Exam: BP 128/81   Pulse 75   Temp (!) 97.1 F (36.2 C) (Oral)   Ht 5\' 1"  (1.549 m)   Wt 167 lb 12.8 oz (76.1 kg)   BMI 31.71 kg/m  General:   Alert and oriented. No distress noted. Pleasant and cooperative.  Head:  Normocephalic and atraumatic. Eyes:  Conjuctiva clear without scleral icterus. Mouth:  Mask in place Abdomen:  +BS, soft, non-tender and non-distended. No rebound or guarding. No HSM or masses noted. Msk:  Symmetrical without gross deformities. Normal posture. Extremities:  Without edema. Neurologic:  Alert and  oriented x4 Psych:  Alert and cooperative. Normal mood and  affect.  ASSESSMENT: [name] [name] is a 59 y.o. female presenting today with history of chronic diarrhea dating back many years, colonoscopy 2018, felt to have IBS-D. In remote past, TTg,IgA level was checked but no IgA alone. I would like to ensure she does not have IgA deficiency, which could result in false negative celiac test.   Appears majority of IBS symptoms may be dietary-related. Bentyl has been used BID daily and more frequently on days with worsened symptoms. Will trial round of Xifaxan X 2 weeks.   Hepatis steatosis: thorough evaluation of mildly elevated transaminases. Ferritin elevated in past but improving, and sats remain less than 40. Will recheck iron studies in Nov 2021 and check hemochromatosis DNA if  remain elevated. However, likely elevated in setting of fatty liver.    PLAN:   IgA, TTg, IgA today  Iron studies in Nov 2021  Diet/exercise discussed  Bentyl prn  Keep food diary  Round of Xifaxan X 2 weeks  Return in 2-3 months  [name], PhD, [facility]

## 2020-01-11 NOTE — Patient Instructions (Signed)
Please have blood work done today at [facility].  Please keep a food/stool diary and see if there are any patterns.   We will see you in 2-3 months!  Continue Bentyl as needed (no more than 8 capsules in one day).  Please complete Hepatitis vaccinations when you are able.  We are doing one round of Xifaxan. You take this 3 times per day for 2 weeks, then stop. Hopefully, you will see some improvement with this.  We will recheck iron studies in Nov 2021  I enjoyed seeing you again today! As you know, I value our relationship and want to provide genuine, compassionate, and quality care. I welcome your feedback. If you receive a survey regarding your visit,  I greatly appreciate you taking time to fill this out. See you next time!  [name], PhD, ANP-BC [facility]

## 2020-01-12 LAB — IGA: IgA/Immunoglobulin A, Serum: 134 mg/dL (ref 87–352)

## 2020-01-12 LAB — TISSUE TRANSGLUTAMINASE, IGA: Transglutaminase IgA: <2 U/mL (ref 0–3)

## 2020-04-05 ENCOUNTER — Telehealth: Payer: Self-pay

## 2020-04-05 NOTE — Telephone Encounter (Signed)
Pt wanting to know if she had  Her second shingrix vacc. Here in our office.

## 2020-04-05 NOTE — Telephone Encounter (Signed)
Pt called to see if we had documented when she got her 2nd shingles shot. I advised pt we only had the one from 05/25/18. Pt can't remember where she got her second one. Pt states if she finds when the 2nd one was, she would let us know so it can be documented. [place]

## 2020-04-19 ENCOUNTER — Other Ambulatory Visit: Payer: Self-pay

## 2020-04-19 ENCOUNTER — Ambulatory Visit: Payer: 59 | Admitting: Gastroenterology

## 2020-04-19 ENCOUNTER — Encounter: Payer: Self-pay | Admitting: Gastroenterology

## 2020-04-19 VITALS — BP 116/80 | HR 69 | Temp 97.1°F | Ht 61.0 in | Wt 168.0 lb

## 2020-04-19 DIAGNOSIS — K580 Irritable bowel syndrome with diarrhea: Secondary | ICD-10-CM

## 2020-04-19 DIAGNOSIS — R945 Abnormal results of liver function studies: Secondary | ICD-10-CM

## 2020-04-19 DIAGNOSIS — R7989 Other specified abnormal findings of blood chemistry: Secondary | ICD-10-CM

## 2020-04-19 MED ORDER — RIFAXIMIN 200 MG PO TABS: 200.0000 mg | ORAL_TABLET | Freq: Three times a day (TID) | ORAL | 0 refills | Status: AC

## 2020-04-19 NOTE — Patient Instructions (Signed)
I have sent in Xifaxan to take three times a day for a 2 week course. Please let us know if you have trouble getting this.   Please complete blood work when you are able.  You can continue dicyclomine up to 4 times per day. If you ever want to try Viberzi, let me know.  We will see you in 6 months!  I enjoyed seeing you again today! As you know, I value our relationship and want to provide genuine, compassionate, and quality care. I welcome your feedback. If you receive a survey regarding your visit,  I greatly appreciate you taking time to fill this out. See you next time!  [name], PhD, ANP-BC [facility]

## 2020-04-19 NOTE — Progress Notes (Signed)
Referring Provider: Jake Samples, PA* Primary Care Physician:  Jake Samples, PA-C Primary GI: Dr. Abbey Chatters  Chief Complaint  Patient presents with  . Abdominal Pain    upper abd across, comes/goes  . Irritable Bowel Syndrome    goes between diarrhea/constipation    HPI:   [name] [name] is a 59 y.o. female presenting today with a history of IBS with diarrhea predominant, elevated transaminases in setting of fatty liver, extensive serologies on file. Plans for repeating ferritin and sats now due to mildly elevated ferritin (sats remained less than 40). Xifaxan provided at last visit. Completed 2 out of 3 Hep B vaccinations. Goes back in March 2022.  Alternates between diarrhea and constipation but mainly diarrhea. Never got to start Xifaxan due to insurance issues. Requesting this to be resent to pharmacy. Bentyl as needed. Taking BID prn.  Bentyl helps slows it down. Wants to avoid Viberzi if necessary. On a bad day, 9-10 stools triggered by certain foods. Bad days rare now that she is on bentyl. Some days won't have a BM. Will have abdominal cramping with loose stool.   Knows what food triggers her: ice cream, dairy products. Uses almond milk. Red meat. Beans.     Past Medical History:  Diagnosis Date  . Anxiety   . Depression   . Essential hypertension   . Hyperlipidemia   . IBS (irritable bowel syndrome)     Past Surgical History:  Procedure Laterality Date  . COLONOSCOPY N/A 01/18/2013   NL ILEUM, NL COLON Bx  . COLONOSCOPY N/A 02/07/2017   Dr. Oneida Alar: internal/external hemorrhoids. next tcc in 10 years.   . ENDOMETRIAL ABLATION  2011   [facility]  . EXCISION MASS NECK Left 10/14/2017   Procedure: EXCISION LEFT MASS NECK;  Surgeon: Leta Baptist, MD;  Location: [place];  Service: ENT;  Laterality: Left;  . TUBAL LIGATION  2011   [facility]    Current Outpatient Medications  Medication Sig Dispense Refill  . cetirizine (ZYRTEC) 10 MG tablet  Take 10 mg by mouth daily.    Marland Kitchen dicyclomine (BENTYL) 10 MG capsule Take one capsule before meals and at bedtime AS NEEDED for abdominal cramps and diarrhea. HOLD for constipation. 120 capsule 2  . hydrocortisone (ANUSOL-HC) 2.5 % rectal cream Place 1 application rectally 2 (two) times daily. (Patient taking differently: Place 1 application rectally as needed. ) 30 g 0  . losartan-hydrochlorothiazide (HYZAAR) 50-12.5 MG tablet Take 1 tablet by mouth daily.    Marland Kitchen PARoxetine (PAXIL) 40 MG tablet Take 40 mg by mouth every morning.     . pravastatin (PRAVACHOL) 40 MG tablet Take 40 mg by mouth daily.     . VENTOLIN HFA 108 (90 Base) MCG/ACT inhaler SMARTSIG:1-2 Puff(s) By Mouth Every 4 Hours PRN    . rifaximin (XIFAXAN) 200 MG tablet Take 1 tablet (200 mg total) by mouth 3 (three) times daily for 14 days. 42 tablet 0   No current facility-administered medications for this visit.    Allergies as of 04/19/2020 - Review Complete 04/19/2020  Allergen Reaction Noted  . Prednisone Swelling 01/11/2013  . Tessalon [benzonatate] Nausea And Vomiting 01/14/2013    Family History  Problem Relation Age of Onset  . Hyperlipidemia Mother   . COPD Mother   . COPD Father   . Hyperlipidemia Brother   . Lung cancer Maternal Grandmother   . Hyperlipidemia Brother   . Diabetes Maternal Uncle   . Colon  cancer Neg Hx   . Colon polyps Neg Hx     Social History   Socioeconomic History  . Marital status: Widowed    Spouse name: Not on file  . Number of children: Not on file  . Years of education: Not on file  . Highest education level: Not on file  Occupational History  . Occupation: Employed    Fish farm manager: [employer]    Comment: [employer]   Tobacco Use  . Smoking status: Never Smoker  . Smokeless tobacco: Never Used  Vaping Use  . Vaping Use: Never used  Substance and Sexual Activity  . Alcohol use: Yes    Alcohol/week: 0.0 standard drinks    Comment: Socially  . Drug use: No  . Sexual activity:  Yes    Birth control/protection: Surgical, None  Other Topics Concern  . Not on file  Social History Narrative  . Not on file   Social Determinants of Health   Financial Resource Strain:   . Difficulty of Paying Living Expenses: Not on file  Food Insecurity:   . Worried About Charity fundraiser in the Last Year: Not on file  . Ran Out of Food in the Last Year: Not on file  Transportation Needs:   . Lack of Transportation (Medical): Not on file  . Lack of Transportation (Non-Medical): Not on file  Physical Activity:   . Days of Exercise per Week: Not on file  . Minutes of Exercise per Session: Not on file  Stress:   . Feeling of Stress : Not on file  Social Connections:   . Frequency of Communication with Friends and Family: Not on file  . Frequency of Social Gatherings with Friends and Family: Not on file  . Attends Religious Services: Not on file  . Active Member of Clubs or Organizations: Not on file  . Attends Archivist Meetings: Not on file  . Marital Status: Not on file    Review of Systems: Gen: Denies fever, chills, anorexia. Denies fatigue, weakness, weight loss.  CV: Denies chest pain, palpitations, syncope, peripheral edema, and claudication. Resp: Denies dyspnea at rest, cough, wheezing, coughing up blood, and pleurisy. GI: see HPI Derm: Denies rash, itching, dry skin Psych: Denies depression, anxiety, memory loss, confusion. No homicidal or suicidal ideation.  Heme: Denies bruising, bleeding, and enlarged lymph nodes.  Physical Exam: BP 116/80   Pulse 69   Temp (!) 97.1 F (36.2 C) (Oral)   Ht 5\' 1"  (1.549 m)   Wt 168 lb (76.2 kg)   BMI 31.74 kg/m  General:   Alert and oriented. No distress noted. Pleasant and cooperative.  Head:  Normocephalic and atraumatic. Eyes:  Conjuctiva clear without scleral icterus. Mouth:  Mask in place Abdomen:  +BS, soft, non-tender and non-distended. No rebound or guarding. No HSM or masses noted. Msk:   Symmetrical without gross deformities. Normal posture. Extremities:  Without edema. Neurologic:  Alert and  oriented x4 Psych:  Alert and cooperative. Normal mood and affect.  ASSESSMENT/PLAN: [name] [name] is a 59 y.o. female presenting today with history of IBS that is diarrhea predominant, fatty liver, mildly elevated transaminases with extensive evaluation thus far.  IBS-D overall improved on dicyclomine, which she takes BID as needed. I would like to try a course of Xifaxan. I have sent to pharmacy and she is to call if unable to pick this up. She is hesitant to trial Viberzi if needed and would like to stick with dicyclomine if  no improvement after Xifaxan.  Update HFP and iron studies today. Elevated ferritin historically but sats normal. Likely due to fatty liver.   Return in 6 months  [name], PhD, [facility]

## 2020-04-20 LAB — IRON,TIBC AND FERRITIN PANEL
Ferritin: 266 ng/mL — ABNORMAL HIGH (ref 15–150)
Iron Saturation: 36 % (ref 15–55)
Iron: 100 ug/dL (ref 27–159)
Total Iron Binding Capacity: 281 ug/dL (ref 250–450)
UIBC: 181 ug/dL (ref 131–425)

## 2020-04-20 LAB — HEPATIC FUNCTION PANEL
ALT: 27 IU/L (ref 0–32)
AST: 35 IU/L (ref 0–40)
Albumin: 4.6 g/dL (ref 3.8–4.9)
Alkaline Phosphatase: 82 IU/L (ref 44–121)
Bilirubin Total: 0.3 mg/dL (ref 0.0–1.2)
Bilirubin, Direct: <0.1 mg/dL (ref 0.00–0.40)
Total Protein: 6.9 g/dL (ref 6.0–8.5)

## 2020-04-24 ENCOUNTER — Telehealth: Payer: Self-pay | Admitting: Internal Medicine

## 2020-04-24 ENCOUNTER — Telehealth: Payer: Self-pay

## 2020-04-24 NOTE — Telephone Encounter (Signed)
Pt is aware that a PA was submitted. Waiting on an approval or denial.

## 2020-04-24 NOTE — Telephone Encounter (Signed)
Pt called to say that she doesn't remember the name of the medicine other than it was a 2 week supply, She said Roseanne Kaufman, NP told her to call if she needed a prior authorization. Pt said that she will need one. She uses [facility] on [address] in [place]. [phone] is her work number.

## 2020-04-24 NOTE — Telephone Encounter (Signed)
PA for Xifaxan has been submitted through [email or web address]. waiting on an approval or denial.

## 2020-04-25 ENCOUNTER — Other Ambulatory Visit: Payer: Self-pay

## 2020-04-25 DIAGNOSIS — R945 Abnormal results of liver function studies: Secondary | ICD-10-CM

## 2020-04-25 DIAGNOSIS — R7989 Other specified abnormal findings of blood chemistry: Secondary | ICD-10-CM

## 2020-04-25 NOTE — Telephone Encounter (Signed)
PA for Xifaxan was denied. Pt needs a DX of Crohn's, UC or diverticulitis. Pt's insurance plan is also changing on 05/08/20 per pts insurance.

## 2020-04-26 NOTE — Telephone Encounter (Signed)
Noted. Maybe we can retry when insurance changes. Continue Bentyl prn for now. If she is willing, we can trial Viberzi. She was wanting to hold off on this at last appointment.

## 2020-04-26 NOTE — Telephone Encounter (Signed)
Spoke with pt. Pt is going to continue to hold off of Viberzi. Pt is aware that Xifaxan was denied. Pt will continue Bentyl for now.

## 2020-04-26 NOTE — Telephone Encounter (Signed)
Lmom waiting on a return call.  

## 2020-05-04 LAB — HEMOCHROMATOSIS DNA-PCR(C282Y,H63D)

## 2020-05-05 ENCOUNTER — Other Ambulatory Visit (HOSPITAL_COMMUNITY): Payer: Self-pay | Admitting: Family Medicine

## 2020-05-05 DIAGNOSIS — Z1231 Encounter for screening mammogram for malignant neoplasm of breast: Secondary | ICD-10-CM

## 2020-06-05 ENCOUNTER — Other Ambulatory Visit: Payer: Self-pay

## 2020-06-05 ENCOUNTER — Ambulatory Visit (HOSPITAL_COMMUNITY)
Admission: RE | Admit: 2020-06-05 | Discharge: 2020-06-05 | Disposition: A | Discharge status: Home / Self Care | Payer: 59 | Source: Ambulatory Visit | Attending: Family Medicine | Admitting: Family Medicine

## 2020-06-05 DIAGNOSIS — Z1231 Encounter for screening mammogram for malignant neoplasm of breast: Secondary | ICD-10-CM | POA: Insufficient documentation

## 2020-10-18 ENCOUNTER — Other Ambulatory Visit: Payer: Self-pay

## 2020-10-18 ENCOUNTER — Encounter: Payer: Self-pay | Admitting: Gastroenterology

## 2020-10-18 ENCOUNTER — Ambulatory Visit: Payer: 59 | Admitting: Gastroenterology

## 2020-10-18 VITALS — BP 119/72 | HR 67 | Temp 97.6°F | Ht 61.0 in | Wt 169.0 lb

## 2020-10-18 DIAGNOSIS — K580 Irritable bowel syndrome with diarrhea: Secondary | ICD-10-CM

## 2020-10-18 DIAGNOSIS — K760 Fatty (change of) liver, not elsewhere classified: Secondary | ICD-10-CM | POA: Diagnosis not present

## 2020-10-18 NOTE — Progress Notes (Signed)
Referring Provider: Jake Samples, PA* Primary Care Physician:  Jake Samples, PA-C Primary GI: Dr. Abbey Chatters   Chief Complaint  Patient presents with  . Irritable Bowel Syndrome    Takes bentyl nightly at bedtime and seems to help slow down the diarrhea    HPI:   [name] [name] is a 60 y.o. female presenting today with a history of  IBS with diarrhea predominant, elevated transaminases in setting of fatty liver, extensive serologies on file. Ferritin elevated historically, most recently 266 and sats are 36. 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 Plans for repeating ferritin and sats serially. Due now. Completed 2 out of 3 Hep B vaccinations.   Was unable to take Xifaxan due to insurance issues. I resent this to the pharmacy at last visit in Oct 2021 but still denied despite PA. Wants to avoid Viberzi.   Red meat triggers diarrhea. Lactose intolerant. Drinks almond milk. Takes dicyclomine nightly. Possibly 2-3 days out of the week with diarrhea. Will take Imodium prn.    Past Medical History:  Diagnosis Date  . Anxiety   . Depression   . Essential hypertension   . Hyperlipidemia   . IBS (irritable bowel syndrome)     Past Surgical History:  Procedure Laterality Date  . COLONOSCOPY N/A 01/18/2013   NL ILEUM, NL COLON Bx  . COLONOSCOPY N/A 02/07/2017   Dr. Oneida Alar: internal/external hemorrhoids. next tcc in 10 years.   . ENDOMETRIAL ABLATION  2011   [facility]  . EXCISION MASS NECK Left 10/14/2017   Procedure: EXCISION LEFT MASS NECK;  Surgeon: Leta Baptist, MD;  Location: [place];  Service: ENT;  Laterality: Left;  . TUBAL LIGATION  2011   [facility]    Current Outpatient Medications  Medication Sig Dispense Refill  . cetirizine (ZYRTEC) 10 MG tablet Take 10 mg by mouth daily.    Marland Kitchen dicyclomine (BENTYL) 10 MG capsule Take one capsule before meals and at bedtime AS NEEDED for abdominal cramps and diarrhea. HOLD for constipation. 120 capsule 2  .  hydrocortisone (ANUSOL-HC) 2.5 % rectal cream Place 1 application rectally 2 (two) times daily. (Patient taking differently: Place 1 application rectally as needed.) 30 g 0  . indomethacin (INDOCIN) 50 MG capsule Take 50 mg by mouth 3 (three) times daily as needed.    Marland Kitchen losartan-hydrochlorothiazide (HYZAAR) 50-12.5 MG tablet Take 1 tablet by mouth daily.    Marland Kitchen PARoxetine (PAXIL) 40 MG tablet Take 40 mg by mouth every morning.     . pravastatin (PRAVACHOL) 40 MG tablet Take 40 mg by mouth daily.     . VENTOLIN HFA 108 (90 Base) MCG/ACT inhaler SMARTSIG:1-2 Puff(s) By Mouth Every 4 Hours PRN     No current facility-administered medications for this visit.    Allergies as of 10/18/2020 - Review Complete 10/18/2020  Allergen Reaction Noted  . Prednisone Swelling 01/11/2013  . Tessalon [benzonatate] Nausea And Vomiting 01/14/2013    Family History  Problem Relation Age of Onset  . Hyperlipidemia Mother   . COPD Mother   . COPD Father   . Hyperlipidemia Brother   . Lung cancer Maternal Grandmother   . Hyperlipidemia Brother   . Diabetes Maternal Uncle   . Colon cancer Neg Hx   . Colon polyps Neg Hx     Social History   Socioeconomic History  . Marital status: Widowed    Spouse name: Not on file  . Number of children: Not on file  .  Years of education: Not on file  . Highest education level: Not on file  Occupational History  . Occupation: Employed    Fish farm manager: [employer]    Comment: [employer]   Tobacco Use  . Smoking status: Never Smoker  . Smokeless tobacco: Never Used  Vaping Use  . Vaping Use: Never used  Substance and Sexual Activity  . Alcohol use: Yes    Alcohol/week: 0.0 standard drinks    Comment: Socially  . Drug use: No  . Sexual activity: Yes    Birth control/protection: Surgical, None  Other Topics Concern  . Not on file  Social History Narrative  . Not on file   Social Determinants of Health   Financial Resource Strain: Not on file  Food Insecurity:  Not on file  Transportation Needs: Not on file  Physical Activity: Not on file  Stress: Not on file  Social Connections: Not on file    Review of Systems: Gen: Denies fever, chills, anorexia. Denies fatigue, weakness, weight loss.  CV: Denies chest pain, palpitations, syncope, peripheral edema, and claudication. Resp: Denies dyspnea at rest, cough, wheezing, coughing up blood, and pleurisy. GI: see HPI Derm: Denies rash, itching, dry skin Psych: Denies depression, anxiety, memory loss, confusion. No homicidal or suicidal ideation.  Heme: Denies bruising, bleeding, and enlarged lymph nodes.  Physical Exam: BP 119/72   Pulse 67   Temp 97.6 F (36.4 C)   Ht 5\' 1"  (1.549 m)   Wt 169 lb (76.7 kg)   BMI 31.93 kg/m  General:   Alert and oriented. No distress noted. Pleasant and cooperative.  Head:  Normocephalic and atraumatic. Eyes:  Conjuctiva clear without scleral icterus. Mouth:  Mask in place Abdomen:  +BS, soft, non-tender and non-distended. No rebound or guarding. No HSM or masses noted. Msk:  Symmetrical without gross deformities. Normal posture. Extremities:  Without edema. Neurologic:  Alert and  oriented x4 Psych:  Alert and cooperative. Normal mood and affect.  ASSESSMENT: [name] [name] is a 60 y.o. female presenting today history of IBS that is diarrhea predominant, fatty liver, mildly elevated transaminases with extensive evaluation thus far.  IBS-D: some improvement with dicyclomine. Unfortunately, unable to provide Xifaxan as not approved by insurance. She declines Viberzi due to potential side effects. Certainly has room for titrating dicyclomine up, as only taking once per day. Celiac serologies negative.   Fatty liver: Update HFP and iron studies. Elevated ferritin historically with normal sats. Hemochromatosis DNA on file as likely unaffected carrier. Ferritin elevated due to fatty liver likely.    PLAN:  Alpha gal panel, iron studies, HFP Increase  dicyclomine up to 4 times per day Return in 6 months  [name], PhD, [facility]

## 2020-10-18 NOTE — Patient Instructions (Signed)
Please complete blood work today, and we will contact you with the results!  You can take dicyclomine up to 4 times per day!  Further recommendations to follow, and we will see you in 6 months!  I enjoyed seeing you again today! As you know, I value our relationship and want to provide genuine, compassionate, and quality care. I welcome your feedback. If you receive a survey regarding your visit,  I greatly appreciate you taking time to fill this out. See you next time!  [name], PhD, ANP-BC [facility]

## 2020-10-24 LAB — HEPATIC FUNCTION PANEL
ALT: 24 IU/L (ref 0–32)
AST: 26 IU/L (ref 0–40)
Albumin: 4.6 g/dL (ref 3.8–4.9)
Alkaline Phosphatase: 85 IU/L (ref 44–121)
Bilirubin Total: 0.2 mg/dL (ref 0.0–1.2)
Bilirubin, Direct: <0.1 mg/dL (ref 0.00–0.40)
Total Protein: 7.2 g/dL (ref 6.0–8.5)

## 2020-10-24 LAB — ALPHA-GAL PANEL
Allergen Lamb IgE: <0.1 kU/L
Beef IgE: 0.11 kU/L — AB
IgE (Immunoglobulin E), Serum: 22 IU/mL (ref 6–495)
O215-IgE Alpha-Gal: 0.76 kU/L — AB
Pork IgE: <0.1 kU/L

## 2020-10-24 LAB — IRON,TIBC AND FERRITIN PANEL
Ferritin: 222 ng/mL — ABNORMAL HIGH (ref 15–150)
Iron Saturation: 25 % (ref 15–55)
Iron: 74 ug/dL (ref 27–159)
Total Iron Binding Capacity: 302 ug/dL (ref 250–450)
UIBC: 228 ug/dL (ref 131–425)

## 2020-10-26 ENCOUNTER — Telehealth: Payer: Self-pay | Admitting: *Deleted

## 2020-10-26 NOTE — Telephone Encounter (Signed)
Pt called back.  Informed her that her liver numbers look good. She was told that ferritin continues to decrease and this is good. Alpha gal panel shows possible sensitivity to beef, so she should avoid this.  Pt voiced understanding.

## 2021-04-19 ENCOUNTER — Ambulatory Visit: Payer: 59 | Admitting: Gastroenterology

## 2021-04-19 ENCOUNTER — Encounter: Payer: Self-pay | Admitting: Gastroenterology

## 2021-05-14 ENCOUNTER — Other Ambulatory Visit (HOSPITAL_COMMUNITY): Payer: Self-pay | Admitting: Family Medicine

## 2021-05-14 DIAGNOSIS — Z1231 Encounter for screening mammogram for malignant neoplasm of breast: Secondary | ICD-10-CM

## 2021-06-11 ENCOUNTER — Ambulatory Visit (HOSPITAL_COMMUNITY)
Admission: RE | Admit: 2021-06-11 | Discharge: 2021-06-11 | Disposition: A | Discharge status: Home / Self Care | Payer: 59 | Source: Ambulatory Visit | Attending: Family Medicine | Admitting: Family Medicine

## 2021-06-11 ENCOUNTER — Other Ambulatory Visit: Payer: Self-pay

## 2021-06-11 DIAGNOSIS — Z1231 Encounter for screening mammogram for malignant neoplasm of breast: Secondary | ICD-10-CM | POA: Diagnosis present

## 2021-09-11 ENCOUNTER — Ambulatory Visit: Payer: 59 | Admitting: Gastroenterology

## 2021-09-11 ENCOUNTER — Other Ambulatory Visit: Payer: Self-pay

## 2021-09-11 ENCOUNTER — Encounter: Payer: Self-pay | Admitting: Gastroenterology

## 2021-09-11 VITALS — BP 110/72 | HR 72 | Temp 97.3°F | Ht 61.0 in | Wt 169.0 lb

## 2021-09-11 DIAGNOSIS — K219 Gastro-esophageal reflux disease without esophagitis: Secondary | ICD-10-CM | POA: Diagnosis not present

## 2021-09-11 DIAGNOSIS — R7989 Other specified abnormal findings of blood chemistry: Secondary | ICD-10-CM

## 2021-09-11 DIAGNOSIS — K580 Irritable bowel syndrome with diarrhea: Secondary | ICD-10-CM | POA: Diagnosis not present

## 2021-09-11 MED ORDER — HYDROCORTISONE (PERIANAL) 2.5 % EX CREA: 1.0000 "application " | TOPICAL_CREAM | Freq: Two times a day (BID) | CUTANEOUS | 1 refills | Status: AC

## 2021-09-11 MED ORDER — PANTOPRAZOLE SODIUM 40 MG PO TBEC: 40.0000 mg | DELAYED_RELEASE_TABLET | Freq: Every day | ORAL | 3 refills | Status: DC

## 2021-09-11 NOTE — Progress Notes (Signed)
? ? ?Gastroenterology Office Note   ? ? ?Primary Care Physician:  Jake Samples, PA-C  ?Primary Gastroenterologist: Dr. Abbey Chatters ? ? ?Chief Complaint  ? ?Chief Complaint  ?Patient presents with  ? Follow-up  ? ? ? ?History of Present Illness  ? ?[name] [name] is a 61 y.o. female presenting today in follow-up with a history of  IBS with diarrhea predominant, elevated transaminases in setting of fatty liver, extensive serologies on file. Ferritin elevated historically, now decreasing. Iron sats last 25 a year ago. Hemochromatosis labs reviewed with likely unaffected carier. Colonoscopy due in 2028.  ? ?Life long crushing of pills. +GERD. No solid food dysphagia. No PPI. Sometimes low volume hematochezia with wiping, especially if diarrhea. Dicyclomine 2-3 times per day, which controls symptoms overall.  ? ?Family history of polyps in brother but hyperplastic.   ? ?Doing well otherwise.  ? ? ?Past Medical History:  ?Diagnosis Date  ? Anxiety   ? Depression   ? Essential hypertension   ? Hyperlipidemia   ? IBS (irritable bowel syndrome)   ? ? ?Past Surgical History:  ?Procedure Laterality Date  ? COLONOSCOPY N/A 01/18/2013  ? NL ILEUM, NL COLON Bx  ? COLONOSCOPY N/A 02/07/2017  ? Dr. Oneida Alar: internal/external hemorrhoids. next tcc in 10 years.   ? ENDOMETRIAL ABLATION  2011  ? [facility]  ? EXCISION MASS NECK Left 10/14/2017  ? Procedure: EXCISION LEFT MASS NECK;  Surgeon: Leta Baptist, MD;  Location: [place];  Service: ENT;  Laterality: Left;  ? TUBAL LIGATION  2011  ? [facility]  ? ? ?Current Outpatient Medications  ?Medication Sig Dispense Refill  ? cetirizine (ZYRTEC) 10 MG tablet Take 10 mg by mouth daily.    ? dicyclomine (BENTYL) 10 MG capsule Take one capsule before meals and at bedtime AS NEEDED for abdominal cramps and diarrhea. HOLD for constipation. 120 capsule 2  ? indomethacin (INDOCIN) 50 MG capsule Take 50 mg by mouth 3 (three) times daily as needed.    ? losartan-hydrochlorothiazide  (HYZAAR) 50-12.5 MG tablet Take 1 tablet by mouth daily.    ? PARoxetine (PAXIL) 40 MG tablet Take 40 mg by mouth every morning.     ? pravastatin (PRAVACHOL) 40 MG tablet Take 40 mg by mouth daily.     ? VENTOLIN HFA 108 (90 Base) MCG/ACT inhaler SMARTSIG:1-2 Puff(s) By Mouth Every 4 Hours PRN    ? hydrocortisone (ANUSOL-HC) 2.5 % rectal cream Place 1 application rectally 2 (two) times daily. (Patient not taking: Reported on 09/11/2021) 30 g 0  ? ?No current facility-administered medications for this visit.  ? ? ?Allergies as of 09/11/2021 - Review Complete 09/11/2021  ?Allergen Reaction Noted  ? Prednisone Swelling 01/11/2013  ? Tessalon [benzonatate] Nausea And Vomiting 01/14/2013  ? ? ?Family History  ?Problem Relation Age of Onset  ? Hyperlipidemia Mother   ? COPD Mother   ? COPD Father   ? Hyperlipidemia Brother   ? Lung cancer Maternal Grandmother   ? Hyperlipidemia Brother   ? Diabetes Maternal Uncle   ? Colon cancer Neg Hx   ? Colon polyps Neg Hx   ? ? ?Social History  ? ?Socioeconomic History  ? Marital status: Widowed  ?  Spouse name: Not on file  ? Number of children: Not on file  ? Years of education: Not on file  ? Highest education level: Not on file  ?Occupational History  ? Occupation: Employed  ?  Employer: [employer]  ?  Comment: [employer]   ?Tobacco Use  ? Smoking status: Never  ? Smokeless tobacco: Never  ?Vaping Use  ? Vaping Use: Never used  ?Substance and Sexual Activity  ? Alcohol use: Yes  ?  Alcohol/week: 0.0 standard drinks  ?  Comment: Socially  ? Drug use: No  ? Sexual activity: Yes  ?  Birth control/protection: Surgical, None  ?Other Topics Concern  ? Not on file  ?Social History Narrative  ? Not on file  ? ?Social Determinants of Health  ? ?Financial Resource Strain: Not on file  ?Food Insecurity: Not on file  ?Transportation Needs: Not on file  ?Physical Activity: Not on file  ?Stress: Not on file  ?Social Connections: Not on file  ?Intimate Partner Violence: Not on file  ? ? ? ?Review  of Systems  ? ?Gen: Denies any fever, chills, fatigue, weight loss, lack of appetite.  ?CV: Denies chest pain, heart palpitations, peripheral edema, syncope.  ?Resp: Denies shortness of breath at rest or with exertion. Denies wheezing or cough.  ?GI: see HPI ?GU : Denies urinary burning, urinary frequency, urinary hesitancy ?MS: Denies joint pain, muscle weakness, cramps, or limitation of movement.  ?Derm: Denies rash, itching, dry skin ?Psych: Denies depression, anxiety, memory loss, and confusion ?Heme: see HPI ? ? ?Physical Exam  ? ?BP 110/72   Pulse 72   Temp (!) 97.3 ?F (36.3 ?C)   Ht '5\' 1"'$  (1.549 m)   Wt 169 lb (76.7 kg)   BMI 31.93 kg/m?  ?General:   Alert and oriented. Pleasant and cooperative. Well-nourished and well-developed.  ?Head:  Normocephalic and atraumatic. ?Eyes:  Without icterus ?Abdomen:  +BS, soft, non-tender and non-distended. No HSM noted. No guarding or rebound. No masses appreciated.  ?Rectal:  Deferred  ?Msk:  Symmetrical without gross deformities. Normal posture. ?Extremities:  Without edema. ?Neurologic:  Alert and  oriented x4;  grossly normal neurologically. ?Skin:  Intact without significant lesions or rashes. ?Psych:  Alert and cooperative. Normal mood and affect. ? ? ?Assessment  ? ?[name] [name] is a 61 y.o. female presenting today in follow-up with a history of  IBS with diarrhea predominant, elevated transaminases in setting of fatty liver, extensive serologies on file. ? ?IBS: doing well with dicyclomine 2-3 times per day.  ? ?Reported low-volume hematochezia: known internal hemorrhoids. We discussed hemorrhoid banding. We also discussed updated colonoscopy. When diarrhea is not flaring, she has no issues. Refilling anusol. Call if no improvement. No FH colorectal cancer or adenomas.  ? ?GERD: currently no PPI. Sent in pantoprazole daily. No concerning signs/symptoms.  ? ?Fatty liver: historically with elevated LFTs but last 2 years have been normal. Diet/behavior sheet  provided. Update labs today.  ? ?PLAN  ? ? ?Consider hemorrhoid banding ?Start pantoprazole daily ?CBC, HFP, iron studies ?Return in 6 months ? ? ?[name], PhD, ANP-BC ?[facility]astroenterology  ? ? ?

## 2021-09-11 NOTE — Patient Instructions (Signed)
I have sent in pantoprazole to take once each morning, 30 minutes before breakfast (for best results). This is for reflux. ? ?Please call if persistent rectal bleeding. ? ?I have ordered routine labs for you today. ? ?We will see you in 6 months! ? ?I enjoyed seeing you again today! As you know, I value our relationship and want to provide genuine, compassionate, and quality care. I welcome your feedback. If you receive a survey regarding your visit,  I greatly appreciate you taking time to fill this out. See you next time! ? ?[name], PhD, ANP-BC ?[facility]  ? ?

## 2021-09-12 LAB — CBC WITH DIFFERENTIAL/PLATELET
Absolute Monocytes: 497 cells/uL (ref 200–950)
Basophils Absolute: 78 cells/uL (ref 0–200)
Basophils Relative: 1.1 %
Eosinophils Absolute: 178 cells/uL (ref 15–500)
Eosinophils Relative: 2.5 %
HCT: 43.2 % (ref 35.0–45.0)
Hemoglobin: 14.5 g/dL (ref 11.7–15.5)
Lymphs Abs: 2549 cells/uL (ref 850–3900)
MCH: 29.7 pg (ref 27.0–33.0)
MCHC: 33.6 g/dL (ref 32.0–36.0)
MCV: 88.3 fL (ref 80.0–100.0)
MPV: 10 fL (ref 7.5–12.5)
Monocytes Relative: 7 %
Neutro Abs: 3799 cells/uL (ref 1500–7800)
Neutrophils Relative %: 53.5 %
Platelets: 264 10*3/uL (ref 140–400)
RBC: 4.89 10*6/uL (ref 3.80–5.10)
RDW: 12.2 % (ref 11.0–15.0)
Total Lymphocyte: 35.9 %
WBC: 7.1 10*3/uL (ref 3.8–10.8)

## 2021-09-12 LAB — HEPATIC FUNCTION PANEL
AG Ratio: 1.7 (calc) (ref 1.0–2.5)
ALT: 23 U/L (ref 6–29)
AST: 23 U/L (ref 10–35)
Albumin: 4.5 g/dL (ref 3.6–5.1)
Alkaline phosphatase (APISO): 68 U/L (ref 37–153)
Bilirubin, Direct: 0.1 mg/dL (ref 0.0–0.2)
Globulin: 2.6 g/dL (calc) (ref 1.9–3.7)
Indirect Bilirubin: 0.3 mg/dL (calc) (ref 0.2–1.2)
Total Bilirubin: 0.4 mg/dL (ref 0.2–1.2)
Total Protein: 7.1 g/dL (ref 6.1–8.1)

## 2021-09-12 LAB — IRON,TIBC AND FERRITIN PANEL
%SAT: 30 % (calc) (ref 16–45)
Ferritin: 150 ng/mL (ref 16–232)
Iron: 97 ug/dL (ref 45–160)
TIBC: 326 mcg/dL (calc) (ref 250–450)

## 2021-10-01 ENCOUNTER — Telehealth: Payer: Self-pay

## 2021-10-01 NOTE — Telephone Encounter (Signed)
noted 

## 2021-10-01 NOTE — Telephone Encounter (Signed)
No, it's all great! ?

## 2021-10-01 NOTE — Telephone Encounter (Signed)
The pt was wanting to know if her liver function was good. I advised her of your note but she thought since the numbers were low she was confused. ?

## 2022-03-14 ENCOUNTER — Ambulatory Visit (INDEPENDENT_AMBULATORY_CARE_PROVIDER_SITE_OTHER): Payer: 59 | Admitting: Gastroenterology

## 2022-03-14 ENCOUNTER — Telehealth: Payer: Self-pay | Admitting: *Deleted

## 2022-03-14 ENCOUNTER — Encounter: Payer: Self-pay | Admitting: Gastroenterology

## 2022-03-14 VITALS — BP 129/79 | HR 72 | Temp 97.9°F | Ht 61.0 in | Wt 169.2 lb

## 2022-03-14 DIAGNOSIS — K580 Irritable bowel syndrome with diarrhea: Secondary | ICD-10-CM

## 2022-03-14 DIAGNOSIS — K760 Fatty (change of) liver, not elsewhere classified: Secondary | ICD-10-CM

## 2022-03-14 MED ORDER — RIFAXIMIN 550 MG PO TABS: 550.0000 mg | ORAL_TABLET | Freq: Three times a day (TID) | ORAL | 0 refills | Status: AC

## 2022-03-14 MED ORDER — DICYCLOMINE HCL 10 MG PO CAPS: ORAL_CAPSULE | ORAL | 3 refills | Status: DC

## 2022-03-14 NOTE — Progress Notes (Signed)
Gastroenterology Office Note     Primary Care Physician:  Scherrie Bateman  Primary Gastroenterologist: Dr. Abbey Chatters    Chief Complaint   Chief Complaint  Patient presents with   Follow-up    Bloating sometimes      History of Present Illness   [name] [name] is a 61 y.o. female presenting today in follow-up with a history of  IBS with diarrhea predominant, GERD, elevated transaminases in setting of fatty liver, extensive serologies on file. Ferritin elevated historically,normal and normal iron sats. 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 Colonoscopy due in 2028.    GERD: rare. Tums just as needed. No need for PPI. No dysphagia.   Fatty liver: historically with elevated LFTs but last 2 years have been normal. Will recheck today.    IBS-D: takes dicyclomine up to TID. Majority of time twice a day. Helps slow down the frequency. Certain foods cause bloating. Goes away after awhile. Negative celiac serologies. Attempted to take Xifaxan in the past but insurance wouldn't cover. Beans cause bloating. Black eyed peas, kidney beans.    Past Medical History:  Diagnosis Date   Anxiety    Depression    Essential hypertension    Hyperlipidemia    IBS (irritable bowel syndrome)     Past Surgical History:  Procedure Laterality Date   COLONOSCOPY N/A 01/18/2013   NL ILEUM, NL COLON Bx   COLONOSCOPY N/A 02/07/2017   Dr. Oneida Alar: internal/external hemorrhoids. next tcc in 10 years.    ENDOMETRIAL ABLATION  2011   [place]   EXCISION MASS NECK Left 10/14/2017   Procedure: EXCISION LEFT MASS NECK;  Surgeon: Leta Baptist, MD;  Location: [place];  Service: ENT;  Laterality: Left;   TUBAL LIGATION  2011   [facility]    Current Outpatient Medications  Medication Sig Dispense Refill   cetirizine (ZYRTEC) 10 MG tablet Take 10 mg by mouth daily.     hydrocortisone (ANUSOL-HC) 2.5 % rectal cream Place 1 application. rectally 2 (two) times  daily. 30 g 1   indomethacin (INDOCIN) 50 MG capsule Take 50 mg by mouth 3 (three) times daily as needed.     losartan-hydrochlorothiazide (HYZAAR) 50-12.5 MG tablet Take 1 tablet by mouth daily.     PARoxetine (PAXIL) 40 MG tablet Take 40 mg by mouth every morning.      pravastatin (PRAVACHOL) 40 MG tablet Take 40 mg by mouth daily.      rifaximin (XIFAXAN) 550 MG TABS tablet Take 1 tablet (550 mg total) by mouth 3 (three) times daily for 14 days. 42 tablet 0   VENTOLIN HFA 108 (90 Base) MCG/ACT inhaler SMARTSIG:1-2 Puff(s) By Mouth Every 4 Hours PRN     dicyclomine (BENTYL) 10 MG capsule Take one capsule before meals and at bedtime AS NEEDED for abdominal cramps and diarrhea. HOLD for constipation. 360 capsule 3   No current facility-administered medications for this visit.    Allergies as of 03/14/2022 - Review Complete 03/14/2022  Allergen Reaction Noted   Prednisone Swelling 01/11/2013   Tessalon [benzonatate] Nausea And Vomiting 01/14/2013    Family History  Problem Relation Age of Onset   Hyperlipidemia Mother    COPD Mother    COPD Father    Hyperlipidemia Brother    Hyperlipidemia Brother    Colon polyps Brother        hyperplastic   Lung cancer Maternal Grandmother    Diabetes Maternal Uncle    Colon  cancer Neg Hx     Social History   Socioeconomic History   Marital status: Widowed    Spouse name: Not on file   Number of children: Not on file   Years of education: Not on file   Highest education level: Not on file  Occupational History   Occupation: Employed    Employer: [employer]    Comment: [employer]   Tobacco Use   Smoking status: Never   Smokeless tobacco: Never  Vaping Use   Vaping Use: Never used  Substance and Sexual Activity   Alcohol use: Yes    Alcohol/week: 0.0 standard drinks of alcohol    Comment: Socially   Drug use: No   Sexual activity: Yes    Birth control/protection: Surgical, None  Other Topics Concern   Not on file  Social  History Narrative   Not on file   Social Determinants of Health   Financial Resource Strain: Not on file  Food Insecurity: Not on file  Transportation Needs: Not on file  Physical Activity: Not on file  Stress: Not on file  Social Connections: Not on file  Intimate Partner Violence: Not on file     Review of Systems   Gen: Denies any fever, chills, fatigue, weight loss, lack of appetite.  CV: Denies chest pain, heart palpitations, peripheral edema, syncope.  Resp: Denies shortness of breath at rest or with exertion. Denies wheezing or cough.  GI: see HPI GU : Denies urinary burning, urinary frequency, urinary hesitancy MS: Denies joint pain, muscle weakness, cramps, or limitation of movement.  Derm: Denies rash, itching, dry skin Psych: Denies depression, anxiety, memory loss, and confusion Heme: Denies bruising, bleeding, and enlarged lymph nodes.   Physical Exam   BP 129/79 (BP Location: Right Arm, Patient Position: Sitting, Cuff Size: Normal)   Pulse 72   Temp 97.9 F (36.6 C) (Temporal)   Ht '5\' 1"'$  (1.549 m)   Wt 169 lb 3.2 oz (76.7 kg)   SpO2 96%   BMI 31.97 kg/m  General:   Alert and oriented. Pleasant and cooperative. Well-nourished and well-developed.  Head:  Normocephalic and atraumatic. Eyes:  Without icterus Abdomen:  +BS, soft, non-tender and non-distended. No HSM noted. No guarding or rebound. No masses appreciated.  Rectal:  Deferred  Msk:  Symmetrical without gross deformities. Normal posture. Extremities:  Without edema. Neurologic:  Alert and  oriented x4;  grossly normal neurologically. Skin:  Intact without significant lesions or rashes. Psych:  Alert and cooperative. Normal mood and affect.   Assessment   [name] [name] is a 61 y.o. female presenting today in follow-up with a history of IBS with diarrhea predominant, MASLD, mild GERD.    IBS-D: dicyclomine BID to TID. Some intermittent bloating food-related. Celiac serologies negative. Will  trial Xifaxan again. Hopefully, insurance will cover. If not, may be able to obtain samples  GERD: rare. Dietary-related and will take OTC agent as needed. No alarm signs/symptoms.   MASLD: historically with elevated LFTs but last 2 years have been normal. Will recheck today. Continue diet/behavior modification.    PLAN   Continue dicyclomine prn Course of Xifaxan if insurance covers: if not, will attempt samples HFP today Return in 6 months Colonoscopy 2028    [name], PhD, ANP-BC [facility] Gastroenter[facility]

## 2022-03-14 NOTE — Patient Instructions (Signed)
Please complete blood work at [facility].  I have sent in [place] to take three times a day for 14 days. We will see if this can be covered and if not, I will look into samples!  We will see you in 6 months!  I enjoyed seeing you again today! As you know, I value our relationship and want to provide genuine, compassionate, and quality care. I welcome your feedback. If you receive a survey regarding your visit,  I greatly appreciate you taking time to fill this out. See you next time!  [name], PhD, ANP-BC [facility]enter[facility]

## 2022-03-14 NOTE — Telephone Encounter (Signed)
Pt needs to try and fail Viberzi.

## 2022-03-15 LAB — HEPATIC FUNCTION PANEL
ALT: 39 IU/L — ABNORMAL HIGH (ref 0–32)
AST: 50 IU/L — ABNORMAL HIGH (ref 0–40)
Albumin: 4.7 g/dL (ref 3.8–4.9)
Alkaline Phosphatase: 90 IU/L (ref 44–121)
Bilirubin Total: 0.4 mg/dL (ref 0.0–1.2)
Bilirubin, Direct: 0.14 mg/dL (ref 0.00–0.40)
Total Protein: 6.9 g/dL (ref 6.0–8.5)

## 2022-03-17 NOTE — Telephone Encounter (Signed)
Noted. I will see about obtaining samples instead.

## 2022-03-18 NOTE — Telephone Encounter (Signed)
Noted  

## 2022-03-26 ENCOUNTER — Telehealth: Payer: Self-pay

## 2022-03-26 NOTE — Telephone Encounter (Signed)
Returned the pt's call left on my vm but vm advises pt cannot be reached at this time.

## 2022-03-29 NOTE — Telephone Encounter (Signed)
FYI: Spoke with pt this morning advised we did not have samples of Xifaxan and it has been awhile since we had received some. She was advised by letter that her Doreene Nest was denied again. Advised the pt we would call if we got in samples

## 2022-04-29 NOTE — Telephone Encounter (Signed)
I have samples of Xifaxan patient may pick up. She will take 3 a day for 14 days total. Please let patient know she can come pick up!

## 2022-04-29 NOTE — Telephone Encounter (Signed)
Noted  

## 2022-04-29 NOTE — Telephone Encounter (Signed)
Phoned and LMOVM for the pt regarding medication and instructions

## 2022-06-27 ENCOUNTER — Other Ambulatory Visit (HOSPITAL_COMMUNITY): Payer: Self-pay | Admitting: Family Medicine

## 2022-06-27 DIAGNOSIS — Z1231 Encounter for screening mammogram for malignant neoplasm of breast: Secondary | ICD-10-CM

## 2022-07-15 ENCOUNTER — Ambulatory Visit (HOSPITAL_COMMUNITY)
Admission: RE | Admit: 2022-07-15 | Discharge: 2022-07-15 | Disposition: A | Discharge status: Home / Self Care | Payer: 59 | Source: Ambulatory Visit | Attending: Family Medicine | Admitting: Family Medicine

## 2022-07-15 ENCOUNTER — Encounter (HOSPITAL_COMMUNITY): Payer: Self-pay

## 2022-07-15 DIAGNOSIS — Z1231 Encounter for screening mammogram for malignant neoplasm of breast: Secondary | ICD-10-CM | POA: Diagnosis not present

## 2022-07-25 ENCOUNTER — Other Ambulatory Visit (HOSPITAL_COMMUNITY): Payer: Self-pay | Admitting: Family Medicine

## 2022-07-25 DIAGNOSIS — R928 Other abnormal and inconclusive findings on diagnostic imaging of breast: Secondary | ICD-10-CM

## 2022-07-30 ENCOUNTER — Ambulatory Visit (HOSPITAL_COMMUNITY)
Admission: RE | Admit: 2022-07-30 | Discharge: 2022-07-30 | Disposition: A | Discharge status: Home / Self Care | Payer: 59 | Source: Ambulatory Visit | Attending: Family Medicine | Admitting: Family Medicine

## 2022-07-30 DIAGNOSIS — R928 Other abnormal and inconclusive findings on diagnostic imaging of breast: Secondary | ICD-10-CM

## 2022-08-06 ENCOUNTER — Encounter (HOSPITAL_COMMUNITY): Payer: 59

## 2022-08-14 ENCOUNTER — Encounter: Payer: Self-pay | Admitting: Gastroenterology

## 2022-10-17 ENCOUNTER — Encounter: Payer: Self-pay | Admitting: Gastroenterology

## 2022-10-17 ENCOUNTER — Ambulatory Visit (INDEPENDENT_AMBULATORY_CARE_PROVIDER_SITE_OTHER): Payer: 59 | Admitting: Gastroenterology

## 2022-10-17 VITALS — BP 111/72 | HR 61 | Temp 97.3°F | Ht 61.0 in | Wt 159.9 lb

## 2022-10-17 DIAGNOSIS — K760 Fatty (change of) liver, not elsewhere classified: Secondary | ICD-10-CM | POA: Diagnosis not present

## 2022-10-17 DIAGNOSIS — K580 Irritable bowel syndrome with diarrhea: Secondary | ICD-10-CM

## 2022-10-17 NOTE — Progress Notes (Signed)
Gastroenterology Office Note     Primary Care Physician:  Ladon Applebaum  Primary Gastroenterologist: Dr. Marletta Lor    Chief Complaint   Chief Complaint  Patient presents with   Follow-up    Patient here today for a follow up appointment due to abnormal liver enzymes, and fatty liver. Patient has occasional diarrhea and takes bentyl prn when this happens.      History of Present Illness   [name] [name] is a 62 y.o. 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 Ferritin elevated historically but improved and normal sats.  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 Colonoscopy due in 2028.    MASLD: recent HFP with normal transaminases, Tbili, alk phos. Last [place] in 2020 with hepatic steatosis. Very rare if any alcohol. Cholesterol in 150s. Recent labs from [facility] reviewed.   Xifaxan has helped in the past. Last took in October 2023. Diarrhea dependent on food choices. Red meat triggers. Chili beans. Will take dicyclomine every morning and each evening. Has tapered off before and was worse. BM normal in between diarrhea. Notes diarrhea only if eating foods that are known triggers.   Past Medical History:  Diagnosis Date   Anxiety    Depression    Essential hypertension    Hyperlipidemia    IBS (irritable bowel syndrome)     Past Surgical History:  Procedure Laterality Date   COLONOSCOPY N/A 01/18/2013   NL ILEUM, NL COLON Bx   COLONOSCOPY N/A 02/07/2017   Dr. Darrick Penna: internal/external hemorrhoids. next tcc in 10 years.    ENDOMETRIAL ABLATION  2011   [place]   EXCISION MASS NECK Left 10/14/2017   Procedure: EXCISION LEFT MASS NECK;  Surgeon: Newman Pies, MD;  Location: [facility];  Service: ENT;  Laterality: Left;   TUBAL LIGATION  2011   [name]    Current Outpatient Medications  Medication Sig Dispense  Refill   atorvastatin (LIPITOR) 40 MG tablet Take 40 mg by mouth daily.     cetirizine (ZYRTEC) 10 MG tablet Take 10 mg by mouth daily.     dicyclomine (BENTYL) 10 MG capsule Take one capsule before meals and at bedtime AS NEEDED for abdominal cramps and diarrhea. HOLD for constipation. 360 capsule 3   hydrocortisone (ANUSOL-HC) 2.5 % rectal cream Place 1 application. rectally 2 (two) times daily. 30 g 1   indomethacin (INDOCIN) 50 MG capsule Take 50 mg by mouth 3 (three) times daily as needed.     JARDIANCE 25 MG TABS tablet Take 25 mg by mouth daily.     LOSARTAN POTASSIUM PO Take by mouth daily.     losartan-hydrochlorothiazide (HYZAAR) 50-12.5 MG tablet Take 1 tablet by mouth daily.     PARoxetine (PAXIL) 20 MG tablet Take 20 mg by mouth every morning.     VENTOLIN HFA 108 (90 Base) MCG/ACT inhaler SMARTSIG:1-2 Puff(s) By Mouth Every 4 Hours PRN     No current facility-administered medications for this visit.    Allergies as of 10/17/2022 - Review Complete 10/17/2022  Allergen Reaction Noted   Prednisone Swelling 01/11/2013   Tessalon [benzonatate] Nausea And Vomiting 01/14/2013    Family History  Problem Relation Age of Onset   Hyperlipidemia Mother    COPD Mother    COPD Father    Hyperlipidemia Brother    Hyperlipidemia Brother    Colon polyps Brother  hyperplastic   Lung cancer Maternal Grandmother    Diabetes Maternal Uncle    Colon cancer Neg Hx     Social History   Socioeconomic History   Marital status: Widowed    Spouse name: Not on file   Number of children: Not on file   Years of education: Not on file   Highest education level: Not on file  Occupational History   Occupation: Employed    Employer: [employer]    Comment: [employer]   Tobacco Use   Smoking status: Never   Smokeless tobacco: Never  Vaping Use   Vaping Use: Never used  Substance and Sexual Activity   Alcohol use: Yes    Alcohol/week: 0.0 standard drinks of alcohol    Comment:  Socially   Drug use: No   Sexual activity: Yes    Birth control/protection: Surgical, None  Other Topics Concern   Not on file  Social History Narrative   Not on file   Social Determinants of Health   Financial Resource Strain: Not on file  Food Insecurity: Not on file  Transportation Needs: Not on file  Physical Activity: Not on file  Stress: Not on file  Social Connections: Not on file  Intimate Partner Violence: Not on file     Review of Systems   Gen: Denies any fever, chills, fatigue, weight loss, lack of appetite.  CV: Denies chest pain, heart palpitations, peripheral edema, syncope.  Resp: Denies shortness of breath at rest or with exertion. Denies wheezing or cough.  GI: Denies dysphagia or odynophagia. Denies jaundice, hematemesis, fecal incontinence. GU : Denies urinary burning, urinary frequency, urinary hesitancy MS: Denies joint pain, muscle weakness, cramps, or limitation of movement.  Derm: Denies rash, itching, dry skin Psych: Denies depression, anxiety, memory loss, and confusion Heme: Denies bruising, bleeding, and enlarged lymph nodes.   Physical Exam   BP 111/72 (BP Location: Right Arm, Patient Position: Sitting, Cuff Size: Normal)   Pulse 61   Temp (!) 97.3 F (36.3 C) (Temporal)   Ht 5\' 1"  (1.549 m)   Wt 159 lb 14.4 oz (72.5 kg)   BMI 30.21 kg/m  General:   Alert and oriented. Pleasant and cooperative. Well-nourished and well-developed.  Head:  Normocephalic and atraumatic. Eyes:  Without icterus Abdomen:  +BS, soft, non-tender and non-distended. No HSM noted. No guarding or rebound. No masses appreciated.  Rectal:  Deferred  Msk:  Symmetrical without gross deformities. Normal posture. Extremities:  Without edema. Neurologic:  Alert and  oriented x4;  grossly normal neurologically. Skin:  Intact without significant lesions or rashes. Psych:  Alert and cooperative. Normal mood and affect.  April 2024 labs via [facility]: Hgb 16, Platelets 271,  Tbili 0.4, Alk Phos 100, ASt 26, ALT 26, total cholesterol 156, TG 122, HDL 58, LDL 76.    Assessment   [name] [name] is a 62 y.o. female presenting today in follow-up with a history of IBS with diarrhea predominant, GERD, MASLD.    IBS-D: symptoms at baseline now and triggered by dietary choices. Avoid triggers, continue dicyclomine BID prn. Colonoscopy due in 2028. Celiac serologies negative.   MASLD: CMP recently in April 2024 reviewed normal. Continue lipid and glycemic control.No signs of advanced liver disease.     PLAN    HFP in 6 months Return in 1 year   Gelene Mink, PhD, [facility]

## 2022-10-17 NOTE — Patient Instructions (Signed)
I am glad you are doing well!  Your liver numbers were normal.  We will see you in 1 year and recheck liver numbers in 6 months!  I enjoyed seeing you again today! At our first visit, I mentioned how I value our relationship and want to provide genuine, compassionate, and quality care. You may receive a survey regarding your visit with me, and I welcome your feedback! Thanks so much for taking the time to complete this. I look forward to seeing you again.   Gelene Mink, PhD, ANP-BC [facility] Gastroenterology

## 2022-12-18 ENCOUNTER — Encounter: Payer: Self-pay | Admitting: *Deleted

## 2022-12-19 ENCOUNTER — Ambulatory Visit: Payer: 59 | Attending: Internal Medicine | Admitting: Internal Medicine

## 2022-12-19 ENCOUNTER — Encounter: Payer: Self-pay | Admitting: Internal Medicine

## 2022-12-19 VITALS — BP 120/76 | HR 65 | Wt 160.2 lb

## 2022-12-19 DIAGNOSIS — R079 Chest pain, unspecified: Secondary | ICD-10-CM | POA: Diagnosis not present

## 2022-12-19 DIAGNOSIS — I10 Essential (primary) hypertension: Secondary | ICD-10-CM | POA: Diagnosis not present

## 2022-12-19 DIAGNOSIS — E785 Hyperlipidemia, unspecified: Secondary | ICD-10-CM | POA: Insufficient documentation

## 2022-12-19 DIAGNOSIS — E7849 Other hyperlipidemia: Secondary | ICD-10-CM

## 2022-12-19 NOTE — Patient Instructions (Addendum)
Medication Instructions:  Your physician recommends that you continue on your current medications as directed. Please refer to the Current Medication list given to you today.   Labwork: None  Testing/Procedures: Your physician has requested that you have an echocardiogram. Echocardiography is a painless test that uses sound waves to create images of your heart. It provides your doctor with information about the size and shape of your heart and how well your heart's chambers and valves are working. This procedure takes approximately one hour. There are no restrictions for this procedure. Please do NOT wear cologne, perfume, aftershave, or lotions (deodorant is allowed). Please arrive 15 minutes prior to your appointment time.  Your physician has requested that you have en exercise stress myoview. For further information please visit [email or web address] Please follow instruction sheet, as given.  Follow-Up: Your physician recommends that you schedule a follow-up appointment in: Pending Results  Any Other Special Instructions Will Be Listed Below (If Applicable).  If you need a refill on your cardiac medications before your next appointment, please call your pharmacy.

## 2022-12-19 NOTE — Progress Notes (Signed)
Cardiology Office Note  Date: 12/19/2022   ID: [name] [name], DOB Sep 09, 1960, MRN [id number]  PCP:  Avis Epley, PA-C  Cardiologist:  Marjo Bicker, MD Electrophysiologist:  None   Reason for Office Visit: Evaluation of chest pain at the request of Jean Rosenthal, [place]   History of Present Illness: [name] [name] is a 62 y.o. female known to have HTN, DM 2, HLD was referred to cardiology clinic for evaluation of chest pain.  Patient started to have chest pains x 3 weeks, occurs especially when she is stressed out, last for 10 to 15 minutes, and resolves with rest/spontaneously.  Denies DOE, dizziness, syncope, palpitations and syncope.  No family history of CAD.  Compliant with medications and has no side effects.  No bleeding issues.  Denies smoking cigarettes.  Past Medical History:  Diagnosis Date   Anxiety    Depression    Essential hypertension    Hyperlipidemia    IBS (irritable bowel syndrome)     Past Surgical History:  Procedure Laterality Date   COLONOSCOPY N/A 01/18/2013   NL ILEUM, NL COLON Bx   COLONOSCOPY N/A 02/07/2017   Dr. Darrick Penna: internal/external hemorrhoids. next tcc in 10 years.    ENDOMETRIAL ABLATION  2011   [place]   EXCISION MASS NECK Left 10/14/2017   Procedure: EXCISION LEFT MASS NECK;  Surgeon: Newman Pies, MD;  Location: [facility];  Service: ENT;  Laterality: Left;   TUBAL LIGATION  2011   [name]    Current Outpatient Medications  Medication Sig Dispense Refill   atorvastatin (LIPITOR) 40 MG tablet Take 40 mg by mouth daily.     cetirizine (ZYRTEC) 10 MG tablet Take 10 mg by mouth daily.     dicyclomine (BENTYL) 10 MG capsule Take one capsule before meals and at bedtime AS NEEDED for abdominal cramps and diarrhea. HOLD for constipation. 360 capsule 3   hydrocortisone (ANUSOL-HC) 2.5 % rectal cream Place 1 application. rectally 2 (two) times daily. 30 g 1   indomethacin (INDOCIN) 50 MG capsule Take 50 mg by mouth 3  (three) times daily as needed.     JARDIANCE 25 MG TABS tablet Take 25 mg by mouth daily.     LOSARTAN POTASSIUM PO Take by mouth daily.     losartan-hydrochlorothiazide (HYZAAR) 50-12.5 MG tablet Take 1 tablet by mouth daily.     PARoxetine (PAXIL) 20 MG tablet Take 20 mg by mouth every morning.     VENTOLIN HFA 108 (90 Base) MCG/ACT inhaler SMARTSIG:1-2 Puff(s) By Mouth Every 4 Hours PRN     No current facility-administered medications for this visit.   Allergies:  Prednisone and Tessalon [benzonatate]   Social History: The patient  reports that she has never smoked. She has never used smokeless tobacco. She reports current alcohol use. She reports that she does not use drugs.   Family History: The patient's family history includes COPD in her father and mother; Colon polyps in her brother; Diabetes in her maternal uncle; Hyperlipidemia in her brother, brother, and mother; Lung cancer in her maternal grandmother.   ROS:  Please see the history of present illness. Otherwise, complete review of systems is positive for none  All other systems are reviewed and negative.   Physical Exam: VS:  BP 120/76   Pulse 65   Wt 160 lb 3.2 oz (72.7 kg)   SpO2 97%   BMI 30.27 kg/m , BMI Body mass index is 30.27 kg/m.  Wt Readings from Last 3 Encounters:  12/19/22 160 lb 3.2 oz (72.7 kg)  10/17/22 159 lb 14.4 oz (72.5 kg)  03/14/22 169 lb 3.2 oz (76.7 kg)    General: Patient appears comfortable at rest. HEENT: Conjunctiva and lids normal, oropharynx clear with moist mucosa. Neck: Supple, no elevated JVP or carotid bruits, no thyromegaly. Lungs: Clear to auscultation, nonlabored breathing at rest. Cardiac: Regular rate and rhythm, no S3 or significant systolic murmur, no pericardial rub. Abdomen: Soft, nontender, no hepatomegaly, bowel sounds present, no guarding or rebound. Extremities: No pitting edema, distal pulses 2+. Skin: Warm and dry. Musculoskeletal: No kyphosis. Neuropsychiatric:  Alert and oriented x3, affect grossly appropriate.  Recent Labwork: 03/14/2022: ALT 39; AST 50     Component Value Date/Time   CHOL 161 12/18/2016 1037   TRIG 156 (H) 12/18/2016 1037   HDL 58 12/18/2016 1037   CHOLHDL 2.8 12/18/2016 1037   LDLCALC 72 12/18/2016 1037    Assessment and Plan:  Patient is a 62 year old F known to have HTN, DM 2, HLD was referred to cardiology clinic for evaluation of chest pains.  # Possibly cardiac chest pain -Patient started to have chest pains x 3 weeks, occurs during stress, last for 10 to 15 minutes and resolves with rest/spontaneously. Cardiac risk factors include HTN and DM 2.  EKG showed NSR, low voltage QRS complexes and poor R wave progression. Physical exam is remarkable for faint/distant heart sounds. Obtain 2D echocardiogram to rule out any pericardial effusion and exercise Myoview to rule out significant CAD.  # HTN, controlled -Continue losartan-HCTZ 50-12.5 mg once daily, f/u with PCP.  # HLD -Continue atorvastatin 40 mg nightly, f/u with PCP.   I have spent a total of 45 minutes with patient reviewing chart, EKGs, labs and examining patient as well as establishing an assessment and plan that was discussed with the patient.  > 50% of time was spent in direct patient care.    Medication Adjustments/Labs and Tests Ordered: Current medicines are reviewed at length with the patient today.  Concerns regarding medicines are outlined above.   Tests Ordered: Orders Placed This Encounter  Procedures   NM Myocar Multi W/Spect W/Wall Motion / EF   EKG 12-Lead   ECHOCARDIOGRAM COMPLETE    Medication Changes: No orders of the defined types were placed in this encounter.   Disposition:  Follow up  pending results  Signed Vicie Cech Verne Spurr, MD, 12/19/2022 5:35 PM    [facility] [address] [place], [place], [place] [address]

## 2022-12-25 ENCOUNTER — Encounter (HOSPITAL_COMMUNITY): Payer: Self-pay

## 2022-12-25 ENCOUNTER — Ambulatory Visit (HOSPITAL_COMMUNITY)
Admission: RE | Admit: 2022-12-25 | Discharge: 2022-12-25 | Disposition: A | Discharge status: Home / Self Care | Payer: 59 | Source: Ambulatory Visit | Attending: Internal Medicine | Admitting: Internal Medicine

## 2022-12-25 ENCOUNTER — Encounter (HOSPITAL_COMMUNITY)
Admission: RE | Admit: 2022-12-25 | Discharge: 2022-12-25 | Disposition: A | Discharge status: Home / Self Care | Payer: 59 | Source: Ambulatory Visit | Attending: Internal Medicine | Admitting: Internal Medicine

## 2022-12-25 DIAGNOSIS — R079 Chest pain, unspecified: Secondary | ICD-10-CM | POA: Insufficient documentation

## 2022-12-25 LAB — NM MYOCAR MULTI W/SPECT W/WALL MOTION / EF
Angina Index: 0
Duke Treadmill Score: 5
Estimated workload: 6.6
Exercise duration (min): 4 min
Exercise duration (sec): 35 s
LV dias vol: 52 mL (ref 46–106)
LV sys vol: 14 mL
MPHR: 160 {beats}/min
Nuc Stress EF: 72 %
Peak HR: 142 {beats}/min
Percent HR: 88 %
RATE: 0.5
RPE: 15
Rest HR: 56 {beats}/min
Rest Nuclear Isotope Dose: 11 mCi
SDS: 3
SRS: 3
SSS: 6
ST Depression (mm): 0.5 mm
Stress Nuclear Isotope Dose: 32 mCi
TID: 1.28

## 2022-12-25 MED ORDER — TECHNETIUM TC 99M TETROFOSMIN IV KIT
30.0000 | PACK | Freq: Once | INTRAVENOUS | Status: AC | PRN
  Administered 2022-12-25: 32 via INTRAVENOUS

## 2022-12-25 MED ORDER — SODIUM CHLORIDE FLUSH 0.9 % IV SOLN
INTRAVENOUS | Status: AC
  Filled 2022-12-25: qty 10

## 2022-12-25 MED ORDER — REGADENOSON 0.4 MG/5ML IV SOLN
INTRAVENOUS | Status: AC
  Filled 2022-12-25: qty 5

## 2022-12-25 MED ORDER — TECHNETIUM TC 99M TETROFOSMIN IV KIT
10.0000 | PACK | Freq: Once | INTRAVENOUS | Status: AC | PRN
  Administered 2022-12-25: 11 via INTRAVENOUS

## 2022-12-26 ENCOUNTER — Telehealth: Payer: Self-pay

## 2022-12-26 MED ORDER — METOPROLOL TARTRATE 25 MG PO TABS: 25.0000 mg | ORAL_TABLET | Freq: Two times a day (BID) | ORAL | 0 refills | Status: DC

## 2022-12-26 NOTE — Telephone Encounter (Signed)
Patient called back to discuss results. Patient verbalized understanding. Requesting morning appointment in 3 months preferably first appointment.

## 2022-12-26 NOTE — Telephone Encounter (Signed)
Called patient and left me to call office and that I sent her a MyChart message regarding her results. Sent in prescription for Metoprolol 25 mg twice a day. Sent copy to PCP

## 2022-12-26 NOTE — Telephone Encounter (Signed)
-----   Message from Marjo Bicker, MD sent at 12/26/2022  8:31 AM EDT ----- Abnormal stress test suggesting clogged artery supplying a small area of the heart versus false alarm. Will treat medically with p.o. metoprolol tartrate 25 mg twice daily for angina and reassess symptoms in 3 months.  Side effects of metoprolol are hypotension and fatigue, hold for BP <90 mm Hg. schedule follow-up in 3 months.

## 2023-01-20 ENCOUNTER — Ambulatory Visit: Payer: 59 | Attending: Internal Medicine

## 2023-01-20 DIAGNOSIS — R079 Chest pain, unspecified: Secondary | ICD-10-CM

## 2023-01-21 LAB — ECHOCARDIOGRAM COMPLETE
AR max vel: 2.01 cm2
AV Area VTI: 1.81 cm2
AV Area mean vel: 1.79 cm2
AV Mean grad: 3 mmHg
AV Peak grad: 5.4 mmHg
Ao pk vel: 1.16 m/s
Area-P 1/2: 3.93 cm2
Calc EF: 63.7 %
MV VTI: 1.43 cm2
S' Lateral: 3.1 cm
Single Plane A2C EF: 64.3 %
Single Plane A4C EF: 63.9 %

## 2023-01-24 ENCOUNTER — Telehealth: Payer: Self-pay | Admitting: Internal Medicine

## 2023-01-24 NOTE — Telephone Encounter (Signed)
Patient is requesting to speak with RN Isabelle Course. Please advise.

## 2023-01-27 NOTE — Telephone Encounter (Signed)
Patient informed. Copy sent to PCP °

## 2023-01-27 NOTE — Telephone Encounter (Signed)
Normal pumping function of the heart and no valvular heart disease. No evidence of pericardial effusion.

## 2023-03-15 ENCOUNTER — Other Ambulatory Visit: Payer: Self-pay | Admitting: Gastroenterology

## 2023-04-04 ENCOUNTER — Other Ambulatory Visit (INDEPENDENT_AMBULATORY_CARE_PROVIDER_SITE_OTHER): Payer: Self-pay

## 2023-04-04 DIAGNOSIS — R7989 Other specified abnormal findings of blood chemistry: Secondary | ICD-10-CM

## 2023-04-07 ENCOUNTER — Encounter: Payer: Self-pay | Admitting: Internal Medicine

## 2023-04-07 ENCOUNTER — Ambulatory Visit: Payer: 59 | Attending: Internal Medicine | Admitting: Internal Medicine

## 2023-04-07 VITALS — BP 100/64 | HR 48 | Ht 61.0 in | Wt 160.0 lb

## 2023-04-07 DIAGNOSIS — Z01818 Encounter for other preprocedural examination: Secondary | ICD-10-CM | POA: Diagnosis not present

## 2023-04-07 DIAGNOSIS — R9439 Abnormal result of other cardiovascular function study: Secondary | ICD-10-CM

## 2023-04-07 NOTE — Progress Notes (Signed)
Cardiology Office Note  Date: 04/07/2023   ID: [name] [name], DOB 06/07/1961, MRN [id number]  PCP:  [name] Epley, PA-C  Cardiologist:  [name] Bicker, MD Electrophysiologist:  None   History of Present Illness: [name] [name] is a 62 y.o. female known to have HTN, DM 2, HLD is here for follow-up visit.  Continued to have chest pains although improved until 03/24/23 when her mother passed away and her chest pain started to get worse. It occurs especially with stress, mostly with rest and sometimes with exertion.  No other symptoms of DOE, orthopnea, PND.  Sometimes has dizziness, her PCP decreased metoprolol tartrate dose to 12.5 mg twice daily, HR today is 48 bpm.  No presyncope, syncope.  No leg swelling.  Echo showed normal LVEF, no valvular heart disease.  Exercise Myoview showed mildly impaired exercise capacity, 0.5 mm downsloping ST depressions and partially reversible apical anteroseptal defect concerning for variable breast attenuation artifact versus minor ischemic territory.  Past Medical History:  Diagnosis Date   Anxiety    Depression    Essential hypertension    Hyperlipidemia    IBS (irritable bowel syndrome)     Past Surgical History:  Procedure Laterality Date   COLONOSCOPY N/A 01/18/2013   NL ILEUM, NL COLON Bx   COLONOSCOPY N/A 02/07/2017   Dr. Darrick [name]: internal/external hemorrhoids. next tcc in 10 years.    ENDOMETRIAL ABLATION  2011   [name] [name]   EXCISION MASS NECK Left 10/14/2017   Procedure: EXCISION LEFT MASS NECK;  Surgeon: [name] Pies, MD;  Location: [name];  Service: ENT;  Laterality: Left;   TUBAL LIGATION  2011   [name] [name]    Current Outpatient Medications  Medication Sig Dispense Refill   atorvastatin (LIPITOR) 40 MG tablet Take 40 mg by mouth daily.     cetirizine (ZYRTEC) 10 MG tablet Take 10 mg by mouth daily.     dicyclomine (BENTYL) 10 MG capsule TAKE 1 CAPSULE BY MOUTH BEFORE MEALS AND BEDTIME AS NEEDED FOR  ABDOMINAL CRAMPS AND DIARRHEA. HOLD FOR CONSTIPATION 360 capsule 3   hydrocortisone (ANUSOL-HC) 2.5 % rectal cream Place 1 application. rectally 2 (two) times daily. 30 g 1   indomethacin (INDOCIN) 50 MG capsule Take 50 mg by mouth 3 (three) times daily as needed.     JARDIANCE 25 MG TABS tablet Take 25 mg by mouth daily.     LOSARTAN POTASSIUM PO Take by mouth daily.     losartan-hydrochlorothiazide (HYZAAR) 50-12.5 MG tablet Take 1 tablet by mouth daily.     metoprolol tartrate (LOPRESSOR) 25 MG tablet Take 1 tablet (25 mg total) by mouth 2 (two) times daily. (Patient taking differently: Take 12.5 mg by mouth 2 (two) times daily.) 180 tablet 0   PARoxetine (PAXIL) 20 MG tablet Take 20 mg by mouth every morning.     VENTOLIN HFA 108 (90 Base) MCG/ACT inhaler SMARTSIG:1-2 Puff(s) By Mouth Every 4 Hours PRN     No current facility-administered medications for this visit.   Allergies:  Prednisone and Tessalon [benzonatate]   Social History: The patient  reports that she has never smoked. She has never used smokeless tobacco. She reports current alcohol use. She reports that she does not use drugs.   Family History: The patient's family history includes COPD in her father and mother; Colon polyps in her brother; Diabetes in her maternal uncle; Hyperlipidemia in her brother, brother, and mother; Lung cancer in her maternal grandmother.  ROS:  Please see the history of present illness. Otherwise, complete review of systems is positive for none  All other systems are reviewed and negative.   Physical Exam: VS:  BP 100/64   Pulse (!) 48   Ht 5\' 1"  (1.549 m)   Wt 160 lb (72.6 kg)   SpO2 97%   BMI 30.23 kg/m , BMI Body mass index is 30.23 kg/m.  Wt Readings from Last 3 Encounters:  04/07/23 160 lb (72.6 kg)  12/19/22 160 lb 3.2 oz (72.7 kg)  10/17/22 159 lb 14.4 oz (72.5 kg)    General: Patient appears comfortable at rest. HEENT: Conjunctiva and lids normal, oropharynx clear with moist  mucosa. Neck: Supple, no elevated JVP or carotid bruits, no thyromegaly. Lungs: Clear to auscultation, nonlabored breathing at rest. Cardiac: Regular rate and rhythm, no S3 or significant systolic murmur, no pericardial rub. Abdomen: Soft, nontender, no hepatomegaly, bowel sounds present, no guarding or rebound. Extremities: No pitting edema, distal pulses 2+. Skin: Warm and dry. Musculoskeletal: No kyphosis. Neuropsychiatric: Alert and oriented x3, affect grossly appropriate.  Recent Labwork: No results found for requested labs within last 365 days.     Component Value Date/Time   CHOL 161 12/18/2016 1037   TRIG 156 (H) 12/18/2016 1037   HDL 58 12/18/2016 1037   CHOLHDL 2.8 12/18/2016 1037   LDLCALC 72 12/18/2016 1037    Assessment and Plan:  Patient is a 62 year old F known to have HTN, DM 2, HLD is here for follow-up of chest pain.  # Possibly cardiac chest pain -Chronic ongoing chest pains with stress x 3 to 4 months, worsened after her mother passed away recently in Apr 17, 2023. Cardiac risk factors include HTN and DM 2. EKG showed NSR, low voltage QRS complexes and poor R wave progression. Echocardiogram showed normal LVEF, no valvular heart disease. Exercise Myoview is equivocal, mildly impaired exercise capacity, 0.5 mm downsloping ST depressions in the inferior and lateral leads, small, mild intensity, apical anteroseptal defect that is partially reversible suggesting measurable breast attenuation artifact versus minor ischemic territory. Based on her chest pain history and exercise Myoview, significant CAD is unlikely however will need to rule out by obtaining CT cardiac. Will continue metoprolol tartrate 12.5 mg twice daily for now and probably discontinue if she has minimal CAD on the CT cardiac. HR today is 48.  Has dizziness sometimes, HR 48 should not cause dizziness, antihypertensive medication dose needs to be reduced in the future if she continues to have dizziness.  # HTN,  controlled -Continue losartan-HCTZ 50-12.5 mg once daily.  Metoprolol dose is reduced due to dizziness and bradycardia by PCP, agree, continue metoprolol tartrate 12.5 twice daily.  If CT cardiac is negative for CAD, will discontinue metoprolol.  # HLD, unknown values -Continue atorvastatin 40 mg nightly, follow-up with PCP.  Goal LDL less than 100.   Disposition:  Follow up  pending results  Signed Hebert Dooling Verne Spurr, MD, 04/07/2023 8:50 AM    [name] at [facility] [address] [place], [place], [place] [address]

## 2023-04-07 NOTE — Patient Instructions (Addendum)
Medication Instructions:  Your physician recommends that you continue on your current medications as directed. Please refer to the Current Medication list given to you today.  *If you need a refill on your cardiac medications before your next appointment, please call your pharmacy*   Lab Work: BMET If you have labs (blood work) drawn today and your tests are completely normal, you will receive your results only by: MyChart Message (if you have MyChart) OR A paper copy in the mail If you have any lab test that is abnormal or we need to change your treatment, we will call you to review the results.   Testing/Procedures:   Your cardiac CT will be scheduled at one of the below locations:   [facility] [address] [place], [place] [address] [phone]   If scheduled at [facility], please arrive at the [facility] and Children's Entrance (Entrance C2) of [facility] 30 minutes prior to test start time. You can use the FREE valet parking offered at entrance C (encouraged to control the heart rate for the test)  Proceed to the [facility] Radiology Department (first floor) to check-in and test prep.  All radiology patients and guests should use entrance C2 at [facility], accessed from [facility], even though the hospital's physical address listed is [address]     Please follow these instructions carefully (unless otherwise directed):   On the Night Before the Test: Be sure to Drink plenty of water. Do not consume any caffeinated/decaffeinated beverages or chocolate 12 hours prior to your test. Do not take any antihistamines 12 hours prior to your test. If the patient has contrast allergy: No Allergy  On the Day of the Test: Drink plenty of water until 1 hour prior to the test. Do not eat any food 1 hour prior to test. You may take your regular medications prior to the test.  Take metoprolol 12.5 mg (Lopressor)  two hours prior to test. If you take Furosemide/Hydrochlorothiazide/Spironolactone, please HOLD on the morning of the test. FEMALES- please wear underwire-free bra if available, avoid dresses & tight clothing       After the Test: Drink plenty of water. After receiving IV contrast, you may experience a mild flushed feeling. This is normal. On occasion, you may experience a mild rash up to 24 hours after the test. This is not dangerous. If this occurs, you can take Benadryl 25 mg and increase your fluid intake. If you experience trouble breathing, this can be serious. If it is severe call 911 IMMEDIATELY. If it is mild, please call our office. If you take any of these medications: Glipizide/Metformin, Avandament, Glucavance, please do not take 48 hours after completing test unless otherwise instructed.  We will call to schedule your test 2-4 weeks out understanding that some insurance companies will need an authorization prior to the service being performed.   For more information and frequently asked questions, please visit our website : [email or web address]  For non-scheduling related questions, please contact the cardiac imaging nurse navigator should you have any questions/concerns: Cardiac Imaging Nurse Navigators Direct Office Dial: [phone]   For scheduling needs, including cancellations and rescheduling, please call [place], [phone].    Follow-Up: At [facility], you and your health needs are our priority.  As part of our continuing mission to provide you with exceptional heart care, we have created designated Provider Care Teams.  These Care Teams include your primary Cardiologist (physician) and Advanced  Practice Providers (APPs -  Physician Assistants and Nurse Practitioners) who all work together to provide you with the care you need, when you need it.  We recommend signing up for the patient portal called "MyChart".  Sign up information is  provided on this After Visit Summary.  MyChart is used to connect with patients for Virtual Visits (Telemedicine).  Patients are able to view lab/test results, encounter notes, upcoming appointments, etc.  Non-urgent messages can be sent to your provider as well.   To learn more about what you can do with MyChart, go to [email or web address].    Your next appointment:    Pending Results  Provider:   You may see Vishnu Norton Pastel, MD or the following Advanced Practice Provider on your designated Care Team:   Sharlene Dory, NP

## 2023-04-07 NOTE — Addendum Note (Signed)
Addended by: Eustace Moore on: 04/07/2023 09:29 AM   Modules accepted: Orders

## 2023-04-12 LAB — BASIC METABOLIC PANEL
BUN/Creatinine Ratio: 15 (ref 12–28)
BUN: 14 mg/dL (ref 8–27)
CO2: 24 mmol/L (ref 20–29)
Calcium: 8.7 mg/dL (ref 8.7–10.3)
Chloride: 102 mmol/L (ref 96–106)
Creatinine, Ser: 0.96 mg/dL (ref 0.57–1.00)
Glucose: 135 mg/dL — ABNORMAL HIGH (ref 70–99)
Potassium: 4.5 mmol/L (ref 3.5–5.2)
Sodium: 140 mmol/L (ref 134–144)
eGFR: 68 mL/min/{1.73_m2} (ref >59)

## 2023-04-18 LAB — HEPATIC FUNCTION PANEL
ALT: 32 [IU]/L (ref 0–32)
AST: 27 [IU]/L (ref 0–40)
Albumin: 4.1 g/dL (ref 3.8–4.9)
Alkaline Phosphatase: 101 [IU]/L (ref 44–121)
Bilirubin Total: 0.4 mg/dL (ref 0.0–1.2)
Bilirubin, Direct: 0.13 mg/dL (ref 0.00–0.40)
Total Protein: 6.5 g/dL (ref 6.0–8.5)

## 2023-04-22 ENCOUNTER — Ambulatory Visit (HOSPITAL_COMMUNITY)
Admission: RE | Admit: 2023-04-22 | Discharge: 2023-04-22 | Disposition: A | Discharge status: Home / Self Care | Payer: 59 | Source: Ambulatory Visit | Attending: Internal Medicine | Admitting: Internal Medicine

## 2023-04-22 DIAGNOSIS — R9439 Abnormal result of other cardiovascular function study: Secondary | ICD-10-CM | POA: Diagnosis present

## 2023-04-22 MED ORDER — IOHEXOL 350 MG/ML SOLN
75.0000 mL | Freq: Once | INTRAVENOUS | Status: AC | PRN
  Administered 2023-04-22: 75 mL via INTRAVENOUS

## 2023-04-29 ENCOUNTER — Telehealth: Payer: Self-pay | Admitting: Internal Medicine

## 2023-04-29 ENCOUNTER — Telehealth: Payer: Self-pay

## 2023-04-29 NOTE — Telephone Encounter (Signed)
Patient is returning call and asking that she gets a call back on work number. Please advise

## 2023-04-29 NOTE — Telephone Encounter (Signed)
-----   Message from Vishnu P Mallipeddi sent at 04/28/2023  9:35 AM EDT ----- CT cardiac was supposed to be ordered (not CT chest/aorta).  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  No visible plaque along the coronary arteries.  Schedule follow-up in 6 months.

## 2023-04-29 NOTE — Telephone Encounter (Signed)
Patient informed and verbalized understanding of plan. 

## 2023-05-21 ENCOUNTER — Other Ambulatory Visit: Payer: Self-pay | Admitting: Internal Medicine

## 2023-06-27 ENCOUNTER — Other Ambulatory Visit (HOSPITAL_COMMUNITY): Payer: Self-pay | Admitting: Family Medicine

## 2023-06-27 DIAGNOSIS — Z1231 Encounter for screening mammogram for malignant neoplasm of breast: Secondary | ICD-10-CM

## 2023-08-04 ENCOUNTER — Ambulatory Visit (HOSPITAL_COMMUNITY)
Admission: RE | Admit: 2023-08-04 | Discharge: 2023-08-04 | Disposition: A | Discharge status: Home / Self Care | Payer: 59 | Source: Ambulatory Visit | Attending: Family Medicine | Admitting: Family Medicine

## 2023-08-04 DIAGNOSIS — Z1231 Encounter for screening mammogram for malignant neoplasm of breast: Secondary | ICD-10-CM | POA: Diagnosis present

## 2023-10-22 ENCOUNTER — Ambulatory Visit (INDEPENDENT_AMBULATORY_CARE_PROVIDER_SITE_OTHER): Payer: 59 | Admitting: Gastroenterology

## 2023-10-22 ENCOUNTER — Encounter: Payer: Self-pay | Admitting: Gastroenterology

## 2023-10-22 VITALS — BP 105/64 | HR 58 | Temp 97.3°F | Ht 61.0 in | Wt 165.7 lb

## 2023-10-22 DIAGNOSIS — K580 Irritable bowel syndrome with diarrhea: Secondary | ICD-10-CM | POA: Diagnosis not present

## 2023-10-22 DIAGNOSIS — K760 Fatty (change of) liver, not elsewhere classified: Secondary | ICD-10-CM

## 2023-10-22 NOTE — Progress Notes (Signed)
 Gastroenterology Office Note     Primary Care Physician:  Ladon Applebaum  Primary Gastroenterologist: Dr. Marletta Lor    Chief Complaint   Chief Complaint  Patient presents with   Follow-up    Patient here today for a follow up on fatty liver. Patient says she has occasional abdominal pain and diarrhea.     History of Present Illness   [name] [name] is a 63 y.o. 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 Ferritin elevated historically but improved and normal sats.  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 Colonoscopy due in 2028.   IBS-D: Certain foods will trigger. Sometimes significant diarrhea if chili beans. Taking dicyclomine about twice a day. Not having diarrhea daily. Only food related. Does not want to do Viberzi. Xifaxan has helped in the past. Sometimes will have no BM and some days 2-3. Taking dicyclomine every day regardless. No overt GI bleeding. Negative celiac serologies in the past.    GERD quiescent. No PPI.   Hepatic steatosis: She is interested in ELF test but is not sure about starting medication if a candidate. [facility] was [facility] by her PCP. On statin for lipid control.   2022 Alpha gal panel with possible sensitivity to beef.   Outside labs April 2025: Hgb 15.6, Platelest 247, Alk Phos 93, AST 27, ALT 24, Tbili 0.5    [place] 2020: Hepatic steatosis, normal spleen.   Past Medical History:  Diagnosis Date   Anxiety    Depression    Essential hypertension    Hyperlipidemia    IBS (irritable bowel syndrome)     Past Surgical History:  Procedure Laterality Date   COLONOSCOPY N/A 01/18/2013   NL ILEUM, NL COLON Bx   COLONOSCOPY N/A 02/07/2017   Dr. Darrick Penna: internal/external hemorrhoids. next tcc in 10 years.    ENDOMETRIAL ABLATION  2011   [place]   EXCISION MASS NECK Left 10/14/2017    Procedure: EXCISION LEFT MASS NECK;  Surgeon: Newman Pies, MD;  Location: [facility];  Service: ENT;  Laterality: Left;   TUBAL LIGATION  2011   [name]    Current Outpatient Medications  Medication Sig Dispense Refill   atorvastatin (LIPITOR) 40 MG tablet Take 40 mg by mouth daily.     cetirizine (ZYRTEC) 10 MG tablet Take 10 mg by mouth daily.     dicyclomine (BENTYL) 10 MG capsule TAKE 1 CAPSULE BY MOUTH BEFORE MEALS AND BEDTIME AS NEEDED FOR ABDOMINAL CRAMPS AND DIARRHEA. HOLD FOR CONSTIPATION 360 capsule 3   hydrocortisone (ANUSOL-HC) 2.5 % rectal cream Place 1 application. rectally 2 (two) times daily. 30 g 1   indomethacin (INDOCIN) 50 MG capsule Take 50 mg by mouth 3 (three) times daily as needed.     JARDIANCE 25 MG TABS tablet Take 12.5 mg by mouth daily.     losartan-hydrochlorothiazide (HYZAAR) 50-12.5 MG tablet Take 1 tablet by mouth daily.     metoprolol tartrate (LOPRESSOR) 25 MG tablet TAKE 1 TABLET(25 MG) BY MOUTH TWICE DAILY 180 tablet 0   PARoxetine (PAXIL) 20 MG tablet Take 40 mg by mouth every morning.     tirzepatide [facility]) 2.5 MG/0.5ML Pen Inject 2.5 mg into the skin once a week.     VENTOLIN HFA 108 (90 Base) MCG/ACT inhaler SMARTSIG:1-2 Puff(s) By Mouth Every 4 Hours PRN     No  current facility-administered medications for this visit.    Allergies as of 10/22/2023 - Review Complete 10/22/2023  Allergen Reaction Noted   Prednisone Swelling 01/11/2013   Tessalon [benzonatate] Nausea And Vomiting 01/14/2013    Family History  Problem Relation Age of Onset   Hyperlipidemia Mother    COPD Mother    COPD Father    Hyperlipidemia Brother    Hyperlipidemia Brother    Colon polyps Brother        hyperplastic   Lung cancer Maternal Grandmother    Diabetes Maternal Uncle    Colon cancer Neg Hx     Social History   Socioeconomic History   Marital status: Widowed    Spouse name: Not on file   Number of children: Not on file   Years of  education: Not on file   Highest education level: Not on file  Occupational History   Occupation: Employed    Employer: [employer]    Comment: [employer]   Tobacco Use   Smoking status: Never   Smokeless tobacco: Never  Vaping Use   Vaping status: Never Used  Substance and Sexual Activity   Alcohol use: Yes    Alcohol/week: 0.0 standard drinks of alcohol    Comment: Socially   Drug use: No   Sexual activity: Yes    Birth control/protection: Surgical, None  Other Topics Concern   Not on file  Social History Narrative   Not on file   Social Drivers of Health   Financial Resource Strain: Not on file  Food Insecurity: Not on file  Transportation Needs: Not on file  Physical Activity: Not on file  Stress: Not on file  Social Connections: Not on file  Intimate Partner Violence: Not on file     Review of Systems   Gen: Denies any fever, chills, fatigue, weight loss, lack of appetite.  CV: Denies chest pain, heart palpitations, peripheral edema, syncope.  Resp: Denies shortness of breath at rest or with exertion. Denies wheezing or cough.  GI: Denies dysphagia or odynophagia. Denies jaundice, hematemesis, fecal incontinence. GU : Denies urinary burning, urinary frequency, urinary hesitancy MS: Denies joint pain, muscle weakness, cramps, or limitation of movement.  Derm: Denies rash, itching, dry skin Psych: Denies depression, anxiety, memory loss, and confusion Heme: Denies bruising, bleeding, and enlarged lymph nodes.   Physical Exam   BP 105/64 (BP Location: Left Arm, Patient Position: Sitting, Cuff Size: Normal)   Pulse (!) 58   Temp (!) 97.3 F (36.3 C) (Temporal)   Ht 5\' 1"  (1.549 m)   Wt 165 lb 11.2 oz (75.2 kg)   BMI 31.31 kg/m  General:   Alert and oriented. Pleasant and cooperative. Well-nourished and well-developed.  Head:  Normocephalic and atraumatic. Eyes:  Without icterus Abdomen:  +BS, soft, non-tender and non-distended. No HSM noted. No guarding or  rebound. No masses appreciated.  Rectal:  Deferred  Msk:  Symmetrical without gross deformities. Normal posture. Extremities:  Without edema. Neurologic:  Alert and  oriented x4;  grossly normal neurologically. Skin:  Intact without significant lesions or rashes. Psych:  Alert and cooperative. Normal mood and affect.   Assessment   [name] [name] is a 63 y.o. 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  IBS-D: doing well on dicyclomine and in fact some days no BM at all. Food triggers directly correlated  with diarrhea. Will wean down dicyclomine to just as needed instead of daily. Colonoscopy on file and due again in 2028.   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 Agree with starting Mounjaro. Discussed weight loss, behavior modifications, diet. Lipids controlled. US  last in 2020 with hepatic steatosis, normal spleen. She would like to hold off on updating this. Agreeable to ELF score. She is hesitant to start Rezdiffra if a candidate. Recent LFTs as above remaining normal.    PLAN  Decrease dicyclomine to prn ELF score. Recommend updated US  abdomen if willing; she would like to defer this now. She is leaning towards avoiding Rezdiffra. Will await labs and can make final decision Continue modification of risk factors for hepatic steatosis, Mediterranean diet provided. Agree with Mounjaro. Colonoscopy 2028 Return in 6 months   Delman Ferns, PhD, [facility] Gastroenterology

## 2023-10-22 NOTE — Patient Instructions (Signed)
 Please have blood work done, and I will message with results!  I have included a diet sheet to help with fatty liver management.  Let's decrease the dicyclomine to just once per day, and you can decrease it to just as needed if tolerated.  We will see you in 6 months!  I enjoyed seeing you again today! I value our relationship and want to provide genuine, compassionate, and quality care. You may receive a survey regarding your visit with me, and I welcome your feedback! Thanks so much for taking the time to complete this. I look forward to seeing you again.      Delman Ferns, PhD, ANP-BC [facility] Gastroenterology

## 2023-10-23 LAB — ENHANCED LIVER FIBROSIS (ELF): ELF(TM) Score: 9.16 (ref <9.80)

## 2023-11-24 IMAGING — MG MM DIGITAL SCREENING BILAT W/ TOMO AND CAD
6 of 12 series · 6 of 36 positions shown · non-contrast
Comparison: Previous exam(s).

CLINICAL DATA: Screening.

EXAM:
DIGITAL SCREENING BILATERAL MAMMOGRAM WITH TOMOSYNTHESIS AND CAD
TECHNIQUE: Bilateral screening digital craniocaudal and mediolateral oblique
mammograms were obtained. Bilateral screening digital breast
tomosynthesis was performed. The images were evaluated with
computer-aided detection.

[R CC synth-2D]
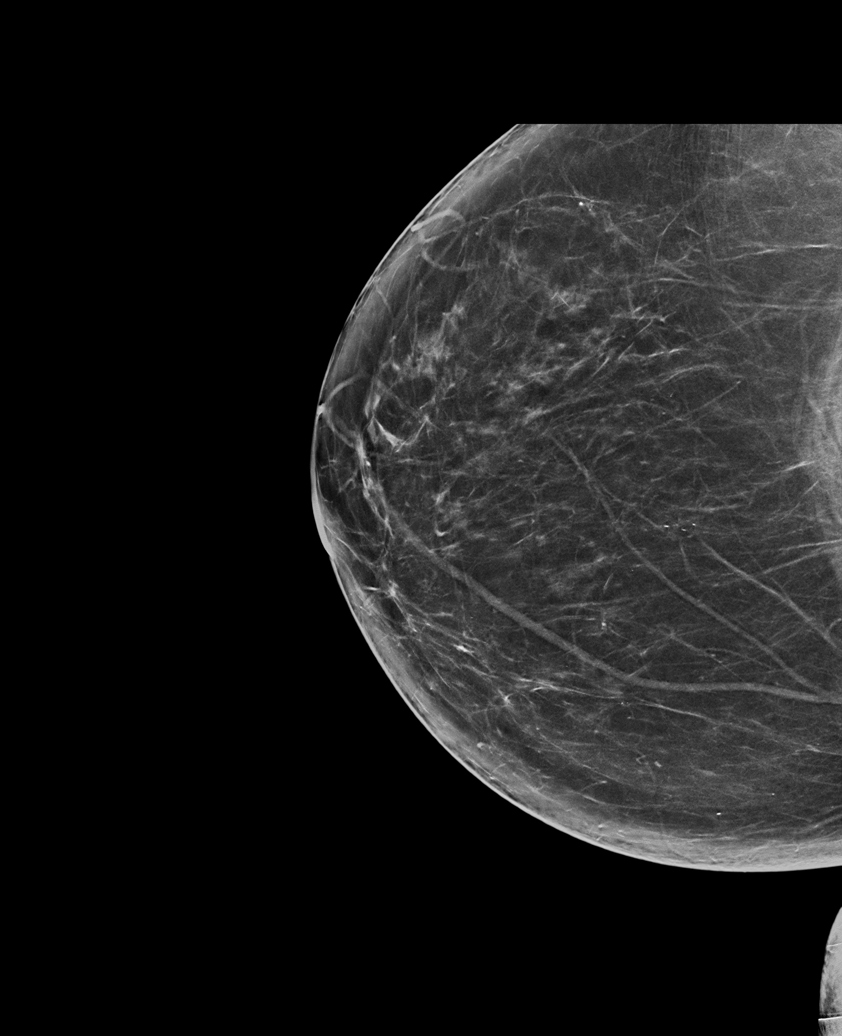

[L CC synth-2D]
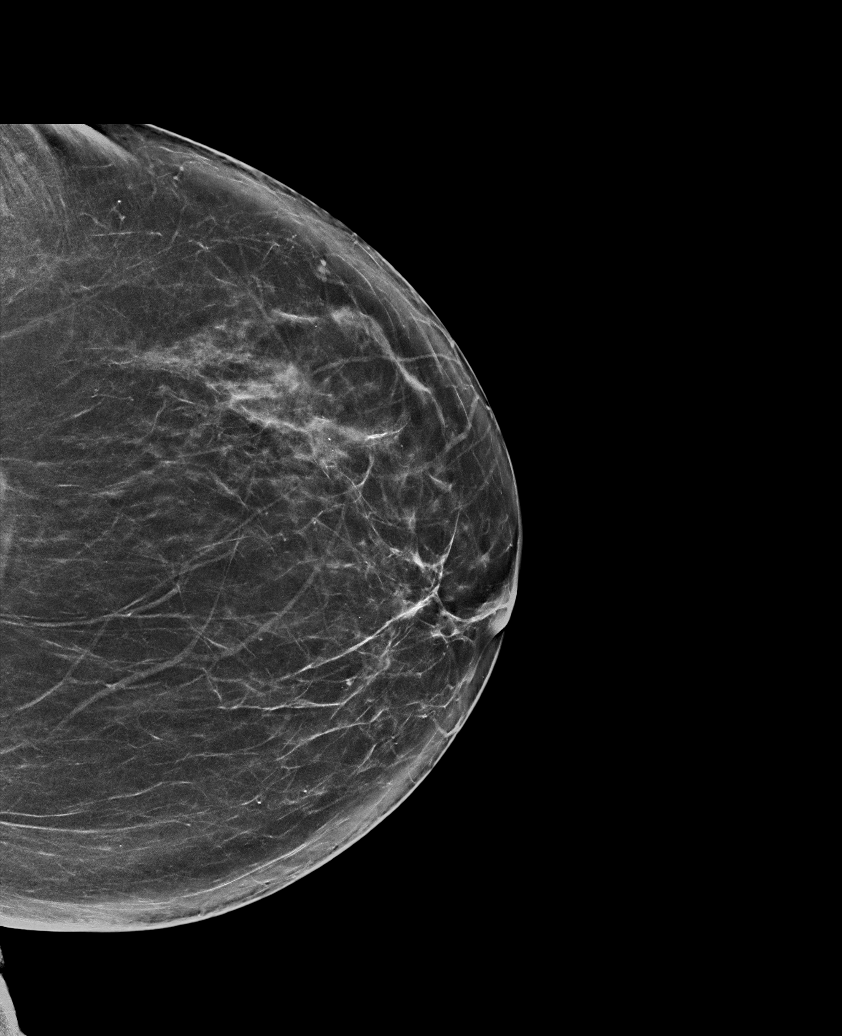

[L MLO synth-2D (1 of 2)]
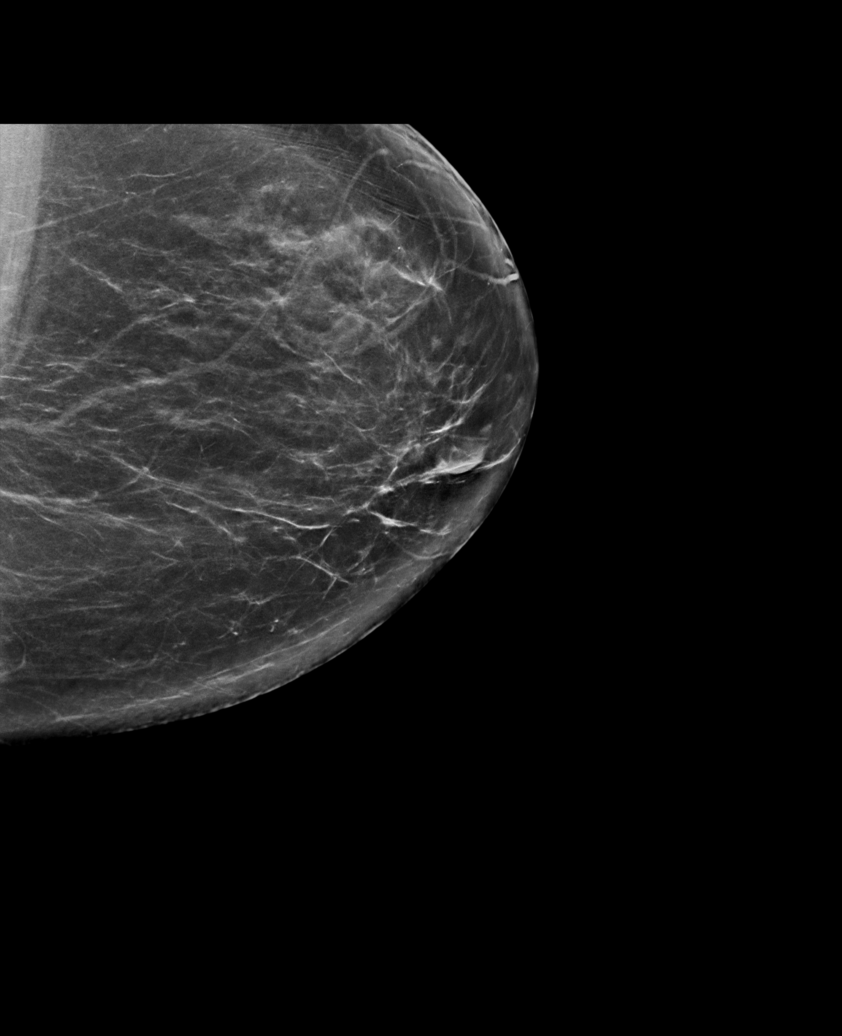

[R MLO synth-2D (1 of 2)]
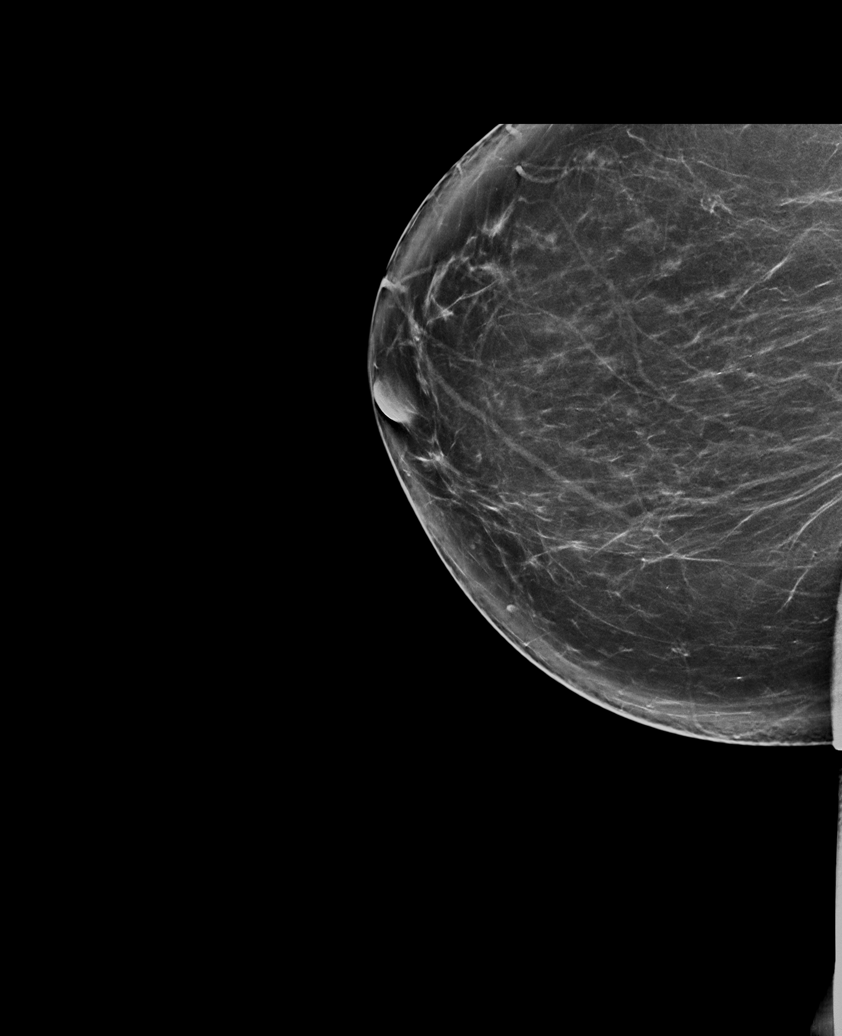

[R MLO synth-2D (2 of 2)]
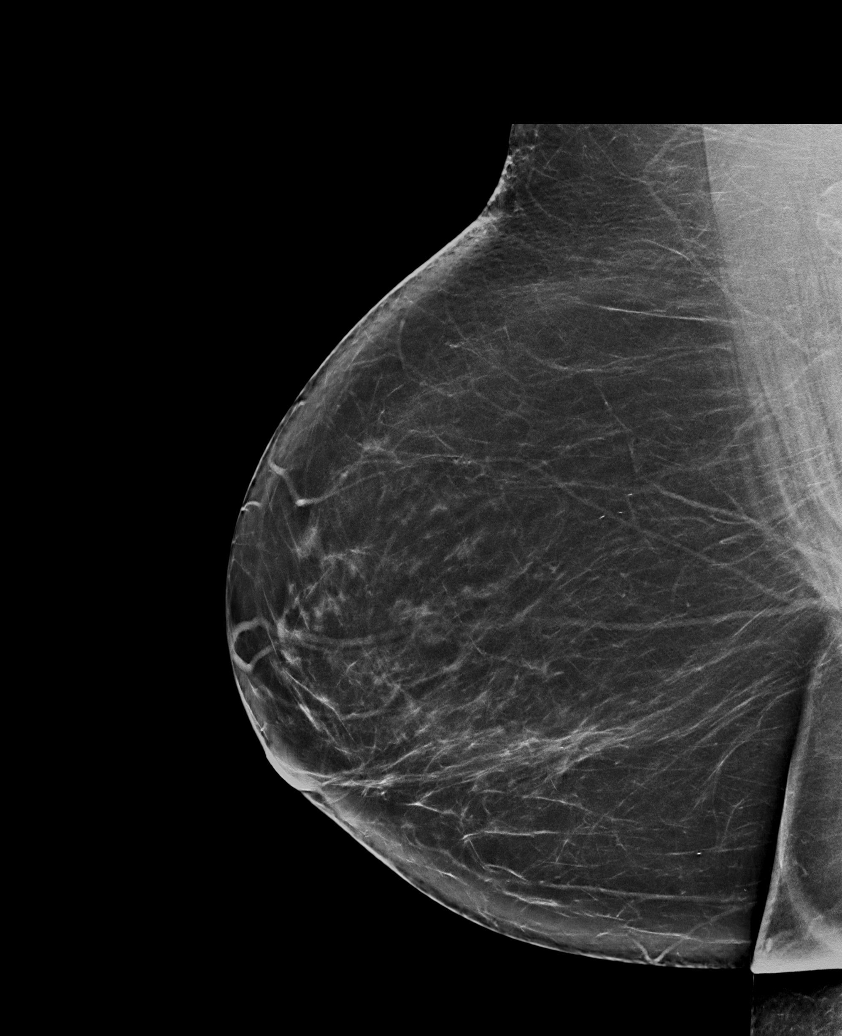

[L MLO synth-2D (2 of 2)]
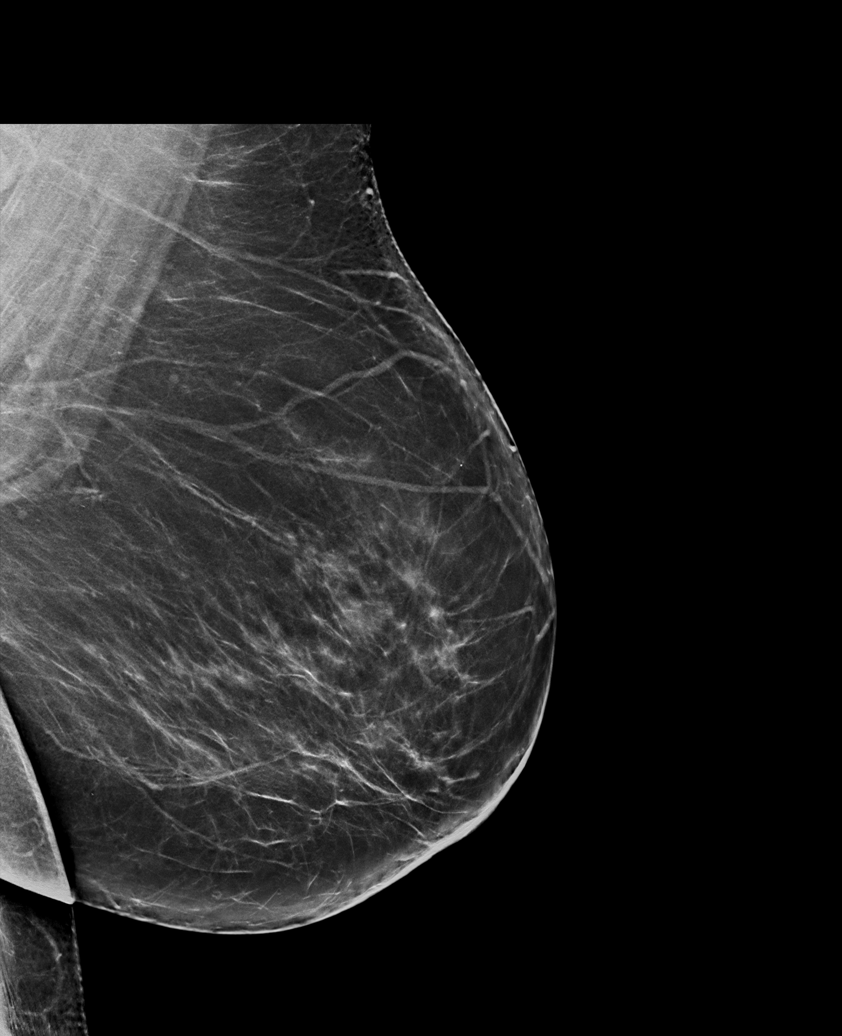

[6 of 36 positions shown; findings below may reference images not displayed]

ACR Breast Density Category b: There are scattered areas of
fibroglandular density.
FINDINGS: There are no findings suspicious for malignancy.
IMPRESSION: No mammographic evidence of malignancy. A result letter of this
screening mammogram will be mailed directly to the patient.

RECOMMENDATION:
Screening mammogram in one year. (Code:51-O-LD2)

BI-RADS CATEGORY  1: Negative.

## 2023-12-06 ENCOUNTER — Other Ambulatory Visit: Payer: Self-pay | Admitting: Internal Medicine

## 2024-02-21 ENCOUNTER — Other Ambulatory Visit: Payer: Self-pay | Admitting: Internal Medicine

## 2024-03-23 ENCOUNTER — Other Ambulatory Visit: Payer: Self-pay | Admitting: Gastroenterology

## 2024-03-23 ENCOUNTER — Encounter: Payer: Self-pay | Admitting: Gastroenterology

## 2024-05-27 ENCOUNTER — Encounter: Payer: Self-pay | Admitting: Gastroenterology

## 2024-05-27 ENCOUNTER — Ambulatory Visit: Admitting: Gastroenterology

## 2024-05-27 VITALS — BP 122/74 | HR 70 | Temp 97.7°F | Ht 61.0 in | Wt 147.4 lb

## 2024-05-27 DIAGNOSIS — K580 Irritable bowel syndrome with diarrhea: Secondary | ICD-10-CM | POA: Diagnosis not present

## 2024-05-27 DIAGNOSIS — K219 Gastro-esophageal reflux disease without esophagitis: Secondary | ICD-10-CM | POA: Diagnosis not present

## 2024-05-27 DIAGNOSIS — K760 Fatty (change of) liver, not elsewhere classified: Secondary | ICD-10-CM | POA: Diagnosis not present

## 2024-05-27 DIAGNOSIS — K529 Noninfective gastroenteritis and colitis, unspecified: Secondary | ICD-10-CM

## 2024-05-27 MED ORDER — ZENPEP 60000-189600 UNITS PO CPEP: 60000.0000 [IU] | ORAL_CAPSULE | ORAL | Status: AC

## 2024-05-27 NOTE — Addendum Note (Signed)
 Addended by: WELLINGTON MILLING on: 05/27/2024 10:44 AM   Modules accepted: Orders

## 2024-05-27 NOTE — Progress Notes (Signed)
 Medication Samples have been provided to the patient.  Drug name: zenpep       Strength: 60,000 units        Qty: 12  LOT: [id number]  Exp.Date: 08/07/2025  Dosing instructions: As Directed  The patient has been instructed regarding the correct time, dose, and frequency of taking this medication, including desired effects and most common side effects.   Madelin CHRISTELLA Boom 10:42 AM 05/27/2024

## 2024-05-27 NOTE — Patient Instructions (Signed)
 Please have blood work done at [facility] and also stool studies and then submit back to [facility].  We may need to update a colonoscopy.  I would just review the blood work and stool test first.  I have given you samples of Zenpep .  These are pancreatic enzymes.  Do not take these yet until I review everything.  I will let you know if you need to start this or not.  If you do take it, you will take 1 capsule with your meals while eating.  It is not helpful if you take it before or after the meal, because it needs to be given while you are eating so that it is absorbed at the same time.  If you are unable to swallow the pill, you can open the pill and put in applesauce and take it this way.   Further recommendations to follow  Happy late birthday!  I enjoyed seeing you again today! I value our relationship and want to provide genuine, compassionate, and quality care. You may receive a survey regarding your visit with me, and I welcome your feedback! Thanks so much for taking the time to complete this. I look forward to seeing you again.      Therisa MICAEL Stager, PhD, ANP-BC [facility]astroenterology

## 2024-05-27 NOTE — Progress Notes (Signed)
 Gastroenterology Office Note     Primary Care Physician:  [name]  Primary Gastroenterologist: Cindie   Chief Complaint   Chief Complaint  Patient presents with   Follow-up    Follow up. Still having issues with her stomach      History of Present Illness   [name] [name] is a 63 y.o. 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 Ferritin elevated historically but improved and normal sats.  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 Colonoscopy due in 2028.    IBS-D: dicyclomine  historically. Improvement with Xifaxan  in past. Negative celiac serologies in past. Certain foods trigger.   dicyclomine  BID prn, sometimes not at all. 4 out of 7 days will take. Dicyclomine  will slow it down. Occasional abdominal cramping that is relieved with dicyclomine . Sometimes stool is oily looking. Rare rectal bleeding if significantly increased bowel habits. Fattier foods cause worsening symptoms with increased diarrhea and cramping.  She notes bloating intermittently depending on types of foods she eats especially of high fatty foods.  Some gas if eating beings or high-fat food.  No food aversions or food fear.  GERD quiescent. No PPI.    Hepatic steatosis: ELF 9.16 . She was started on Mounjaro by her PCP. On statin for lipid control. Down 20 lbs from April 2025, purposefully with Mounjaro.   Outside labs from July 2025 with vitamin D low at 72, and she has been started on supplementation.  TSH was normal at 1.81.  Hemoglobin 15.7, platelets 229.  T. bili 0.6, alk phos 91, AST 21, ALT 19.  Cholesterol 130, LDL 63, HDL 47.   2022 Alpha gal panel with possible sensitivity to beef.    Us  2020: Hepatic steatosis, normal spleen   Colonoscopy 2018: internal hemorrhoids   Past Medical History:  Diagnosis Date   Anxiety    Depression     Diabetes (HCC)    Essential hypertension    Hyperlipidemia    IBS (irritable bowel syndrome)     Past Surgical History:  Procedure Laterality Date   COLONOSCOPY N/A 01/18/2013   NL ILEUM, NL COLON Bx   COLONOSCOPY N/A 02/07/2017   Dr. Harvey: internal/external hemorrhoids. next tcc in 10 years.    ENDOMETRIAL ABLATION  2011   [place]   EXCISION MASS NECK Left 10/14/2017   Procedure: EXCISION LEFT MASS NECK;  Surgeon: Karis Clunes, MD;  Location: [facility];  Service: ENT;  Laterality: Left;   TUBAL LIGATION  2011   [place]    Current Outpatient Medications  Medication Sig Dispense Refill   atorvastatin (LIPITOR) 40 MG tablet Take 40 mg by mouth daily.     cetirizine (ZYRTEC) 10 MG tablet Take 10 mg by mouth daily.     dicyclomine  (BENTYL ) 10 MG capsule TAKE 1 CAPSULE BY MOUTH BEFORE MEALS AND BEDTIME AS NEEDED FOR ABDOMINAL CRAMPS AND DIARRHEA. HOLD FOR CONSTIPATION 360 capsule 3   hydrocortisone  (ANUSOL -HC) 2.5 % rectal cream Place 1 application. rectally 2 (two) times daily. (Patient taking differently: Place 1 application  rectally 2 (two) times daily. PRN) 30 g 1   indomethacin (INDOCIN) 50 MG capsule Take 50 mg by mouth 3 (three) times daily as needed.     JARDIANCE 25 MG TABS tablet Take 12.5 mg by mouth daily.     losartan (COZAAR) 25 MG tablet Take 25 mg by mouth  daily.     losartan-hydrochlorothiazide (HYZAAR) 50-12.5 MG tablet Take 1 tablet by mouth daily.     metoprolol  tartrate (LOPRESSOR ) 25 MG tablet TAKE 1 TABLET(25 MG) BY MOUTH TWICE DAILY 60 tablet 0   PARoxetine (PAXIL) 20 MG tablet Take 40 mg by mouth every morning.     tirzepatide (MOUNJARO) 2.5 MG/0.5ML Pen Inject 2.5 mg into the skin once a week.     VENTOLIN HFA 108 (90 Base) MCG/ACT inhaler SMARTSIG:1-2 Puff(s) By Mouth Every 4 Hours PRN (Patient taking differently: PRN)     No current facility-administered medications for this visit.    Allergies as of 05/27/2024 - Review Complete 05/27/2024   Allergen Reaction Noted   Amoxicillin -pot clavulanate  05/27/2024   Prednisone Swelling 01/11/2013   Tessalon [benzonatate] Nausea And Vomiting 01/14/2013    Family History  Problem Relation Age of Onset   Hyperlipidemia Mother    COPD Mother    COPD Father    Hyperlipidemia Brother    Hyperlipidemia Brother    Colon polyps Brother        hyperplastic   Lung cancer Maternal Grandmother    Diabetes Maternal Uncle    Colon cancer Neg Hx    Pancreatic cancer Neg Hx     Social History   Socioeconomic History   Marital status: Widowed    Spouse name: Not on file   Number of children: Not on file   Years of education: Not on file   Highest education level: Not on file  Occupational History   Occupation: Employed    Employer: [employer]    Comment: [employer]   Tobacco Use   Smoking status: Never   Smokeless tobacco: Never  Vaping Use   Vaping status: Never Used  Substance and Sexual Activity   Alcohol use: Yes    Alcohol/week: 0.0 standard drinks of alcohol    Comment: Socially   Drug use: No   Sexual activity: Yes    Birth control/protection: Surgical, None  Other Topics Concern   Not on file  Social History Narrative   Not on file   Social Drivers of Health   Financial Resource Strain: Not on file  Food Insecurity: Not on file  Transportation Needs: Not on file  Physical Activity: Not on file  Stress: Not on file  Social Connections: Not on file  Intimate Partner Violence: Not on file     Review of Systems   Gen: Denies any fever, chills, fatigue, weight loss, lack of appetite.  CV: Denies chest pain, heart palpitations, peripheral edema, syncope.  Resp: Denies shortness of breath at rest or with exertion. Denies wheezing or cough.  GI: Denies dysphagia or odynophagia. Denies jaundice, hematemesis, fecal incontinence. GU : Denies urinary burning, urinary frequency, urinary hesitancy MS: Denies joint pain, muscle weakness, cramps, or limitation of  movement.  Derm: Denies rash, itching, dry skin Psych: Denies depression, anxiety, memory loss, and confusion Heme: Denies bruising, bleeding, and enlarged lymph nodes.   Physical Exam   BP 122/74 (BP Location: Right Arm, Patient Position: Sitting, Cuff Size: Normal)   Pulse 70   Temp 97.7 F (36.5 C) (Temporal)   Ht 5' 1 (1.549 m)   Wt 147 lb 6.4 oz (66.9 kg)   BMI 27.85 kg/m  General:   Alert and oriented. Pleasant and cooperative. Well-nourished and well-developed.  Head:  Normocephalic and atraumatic. Eyes:  Without icterus Abdomen:  +BS, soft, non-tender and non-distended. No HSM noted. No guarding or rebound. No masses  appreciated.  Rectal:  Deferred  Msk:  Symmetrical without gross deformities. Normal posture. Extremities:  Without edema. Neurologic:  Alert and  oriented x4;  grossly normal neurologically. Skin:  Intact without significant lesions or rashes. Psych:  Alert and cooperative. Normal mood and affect.   Assessment   [name] [name] is a 63 y.o. female presenting today with a history of  suspected IBS with diarrhea predominant, MASLD, returning in routine follow-up for history of irritable bowel syndrome.  Suspected IBS with diarrhea: She has been taking dicyclomine  as needed with some improvement.  Her symptoms are not worsened from her chronic baseline, but she is interested in further evaluation and is concerned about underlying pancreatic issues.  She does have a history of diabetes and could certainly have an element of mild EPI.  She has lost weight purposefully on Mounjaro.  Her symptoms really do seem to correlate with higher fatty foods.  Colonoscopy last in 2018.  Historically, the TI appeared normal.  We will update the alpha gal panel, inflammatory markers, fecal Cal, and I will also check fecal elastase.  Additional labs such as CBC, CMP, TSH are all recently done and normal as noted above.    As colonoscopy has been last in 2018, we can certainly  update this and perform random colonic biopsies although I doubt we are dealing with microscopic colitis.  She has noted low-volume hematochezia if multiple bowel movements, but she has a history of known internal hemorrhoids, no anemia on recent blood counts.  I discussed with her that a colonoscopy would be prudent in the near future after review of labs.  MASLD: Prior elevated transaminases now resolved with extensive serologies on file.  Elf score 9.16.  This is right at the borderline of where we need to be following as anything above 9.2 will need to be investigated further.  Spleen was normal historically, and no thrombocytopenia.  For now we will continue with strict glycemic control, lipid management, purposeful weight loss, dietary modification.  She is not entirely interested in pharmacological measures such as Rezdiffra.       PLAN    Check CRP, sed rate, alpha gal panel, fecal Cal, fecal elastase Consider updated colonoscopy if any further rectal bleeding Depending on labs, we will be pursuing a colonoscopy versus imaging. I have given her samples of Zenpep to keep on hand but not start yet as she lives in [place].  She is aware not to start this yet until after review of labs and stool test. Further recommendations to follow.   Therisa MICAEL Stager, PhD, ANP-BC [facility]

## 2024-06-01 LAB — ALPHA-GAL PANEL
Allergen Lamb IgE: 0.64 kU/L — AB
Beef IgE: 1.45 kU/L — AB
IgE (Immunoglobulin E), Serum: 14 [IU]/mL (ref 6–495)
O215-IgE Alpha-Gal: 4.14 kU/L — AB
Pork IgE: 0.67 kU/L — AB

## 2024-06-01 LAB — C-REACTIVE PROTEIN: CRP: 2 mg/L (ref 0–10)

## 2024-06-01 LAB — SEDIMENTATION RATE: Sed Rate: 2 mm/h (ref 0–40)

## 2024-06-02 ENCOUNTER — Ambulatory Visit: Payer: Self-pay | Admitting: Gastroenterology

## 2024-06-02 MED ORDER — EPINEPHRINE 0.3 MG/0.3ML IJ SOAJ: 0.3000 mg | INTRAMUSCULAR | 1 refills | Status: AC | PRN

## 2024-06-07 LAB — CALPROTECTIN, FECAL: Calprotectin, Fecal: 9 ug/g (ref 0–120)

## 2024-06-07 LAB — PANCREATIC ELASTASE, FECAL: Pancreatic Elastase, Fecal: >800 ug Elast./g (ref >200)

## 2024-06-14 ENCOUNTER — Other Ambulatory Visit (HOSPITAL_COMMUNITY)
Admission: RE | Admit: 2024-06-14 | Discharge: 2024-06-14 | Disposition: A | Discharge status: Home / Self Care | Source: Ambulatory Visit | Attending: Obstetrics & Gynecology | Admitting: Obstetrics & Gynecology

## 2024-06-14 ENCOUNTER — Encounter: Payer: Self-pay | Admitting: Obstetrics & Gynecology

## 2024-06-14 ENCOUNTER — Ambulatory Visit: Admitting: Obstetrics & Gynecology

## 2024-06-14 VITALS — BP 112/71 | HR 51 | Ht 61.0 in | Wt 151.0 lb

## 2024-06-14 DIAGNOSIS — Z113 Encounter for screening for infections with a predominantly sexual mode of transmission: Secondary | ICD-10-CM

## 2024-06-14 DIAGNOSIS — Z01419 Encounter for gynecological examination (general) (routine) without abnormal findings: Secondary | ICD-10-CM

## 2024-06-14 NOTE — Progress Notes (Signed)
 Subjective:     [name] [name] is a 63 y.o. female here for a routine exam.  No LMP recorded. Patient has had an ablation. G3P3 Birth Control Method:  tubal + ablation Menstrual Calendar(currently): amenorrhea  Current complaints: none.   Current acute medical issues:  Type 2 DM   Recent Gynecologic History No LMP recorded. Patient has had an ablation. Last Pap: 2021,  normal Last mammogram: 07/2023,  normal  Past Medical History:  Diagnosis Date   Anxiety    Depression    Diabetes (HCC)    Essential hypertension    Hyperlipidemia    IBS (irritable bowel syndrome)     Past Surgical History:  Procedure Laterality Date   COLONOSCOPY N/A 01/18/2013   NL ILEUM, NL COLON Bx   COLONOSCOPY N/A 02/07/2017   Dr. Harvey: internal/external hemorrhoids. next tcc in 10 years.    ENDOMETRIAL ABLATION  2011   [place]   EXCISION MASS NECK Left 10/14/2017   Procedure: EXCISION LEFT MASS NECK;  Surgeon: Karis Clunes, MD;  Location: [facility];  Service: ENT;  Laterality: Left;   TUBAL LIGATION  2011   [place]    OB History     Gravida  3   Para  3   Term      Preterm      AB      Living         SAB      IAB      Ectopic      Multiple      Live Births              Social History   Socioeconomic History   Marital status: Widowed    Spouse name: Not on file   Number of children: Not on file   Years of education: Not on file   Highest education level: Not on file  Occupational History   Occupation: Employed    Employer: [employer]    Comment: [employer]   Tobacco Use   Smoking status: Never   Smokeless tobacco: Never  Vaping Use   Vaping status: Never Used  Substance and Sexual Activity   Alcohol use: Yes    Alcohol/week: 0.0 standard drinks of alcohol    Comment: Socially   Drug use: No   Sexual activity: Yes    Birth control/protection: Surgical, None    Comment: tubal  Other Topics Concern   Not on file  Social History Narrative    Not on file   Social Drivers of Health   Financial Resource Strain: Not on file  Food Insecurity: No Food Insecurity (06/14/2024)   Hunger Vital Sign    Worried About Running Out of Food in the Last Year: Never true    Ran Out of Food in the Last Year: Never true  Transportation Needs: Not on file  Physical Activity: Not on file  Stress: No Stress Concern Present (06/14/2024)   [employer] of Occupational [employer] - Occupational Stress Questionnaire    Feeling of Stress: Only a little  Social Connections: Not on file    Family History  Problem Relation Age of Onset   Lung cancer Maternal Grandmother    COPD Father    Hyperlipidemia Mother    COPD Mother    Hyperlipidemia Brother    Hyperlipidemia Brother    Colon polyps Brother        hyperplastic   Diabetes Maternal Uncle    Colon cancer  Neg Hx    Pancreatic cancer Neg Hx      Current Outpatient Medications:    atorvastatin (LIPITOR) 40 MG tablet, Take 40 mg by mouth daily., Disp: , Rfl:    cetirizine (ZYRTEC) 10 MG tablet, Take 10 mg by mouth daily., Disp: , Rfl:    dicyclomine  (BENTYL ) 10 MG capsule, TAKE 1 CAPSULE BY MOUTH BEFORE MEALS AND BEDTIME AS NEEDED FOR ABDOMINAL CRAMPS AND DIARRHEA. HOLD FOR CONSTIPATION, Disp: 360 capsule, Rfl: 3   EPINEPHrine  0.3 mg/0.3 mL IJ SOAJ injection, Inject 0.3 mg into the muscle as needed for anaphylaxis., Disp: 2 each, Rfl: 1   hydrocortisone  (ANUSOL -HC) 2.5 % rectal cream, Place 1 application. rectally 2 (two) times daily., Disp: 30 g, Rfl: 1   indomethacin (INDOCIN) 50 MG capsule, Take 50 mg by mouth 3 (three) times daily as needed., Disp: , Rfl:    JARDIANCE 10 MG TABS tablet, Take 10 mg by mouth daily., Disp: , Rfl:    losartan (COZAAR) 25 MG tablet, Take 25 mg by mouth daily., Disp: , Rfl:    metoprolol  tartrate (LOPRESSOR ) 25 MG tablet, TAKE 1 TABLET(25 MG) BY MOUTH TWICE DAILY, Disp: 60 tablet, Rfl: 0   MOUNJARO 5 MG/0.5ML Pen, Inject 5 mg into the skin once a week., Disp:  , Rfl:    PARoxetine (PAXIL) 20 MG tablet, Take 40 mg by mouth every morning., Disp: , Rfl:    tirzepatide (MOUNJARO) 2.5 MG/0.5ML Pen, Inject 2.5 mg into the skin once a week., Disp: , Rfl:    losartan-hydrochlorothiazide (HYZAAR) 50-12.5 MG tablet, Take 1 tablet by mouth daily. (Patient not taking: Reported on 06/14/2024), Disp: , Rfl:    Pancrelipase , Lip-Prot-Amyl, (ZENPEP ) 39999-810399 units CPEP, Take 60,000 units of lipase by mouth as directed. (Patient not taking: Reported on 06/14/2024), Disp: , Rfl:    VENTOLIN HFA 108 (90 Base) MCG/ACT inhaler, SMARTSIG:1-2 Puff(s) By Mouth Every 4 Hours PRN (Patient taking differently: PRN), Disp: , Rfl:   Review of Systems  Review of Systems  Constitutional: Negative for fever, chills, weight loss, malaise/fatigue and diaphoresis.  HENT: Negative for hearing loss, ear pain, nosebleeds, congestion, sore throat, neck pain, tinnitus and ear discharge.   Eyes: Negative for blurred vision, double vision, photophobia, pain, discharge and redness.  Respiratory: Negative for cough, hemoptysis, sputum production, shortness of breath, wheezing and stridor.   Cardiovascular: Negative for chest pain, palpitations, orthopnea, claudication, leg swelling and PND.  Gastrointestinal: negative for abdominal pain. Negative for heartburn, nausea, vomiting, diarrhea, constipation, blood in stool and melena.  Genitourinary: Negative for dysuria, urgency, frequency, hematuria and flank pain.  Musculoskeletal: Negative for myalgias, back pain, joint pain and falls.  Skin: Negative for itching and rash.  Neurological: Negative for dizziness, tingling, tremors, sensory change, speech change, focal weakness, seizures, loss of consciousness, weakness and headaches.  Endo/Heme/Allergies: Negative for environmental allergies and polydipsia. Does not bruise/bleed easily.  Psychiatric/Behavioral: Negative for depression, suicidal ideas, hallucinations, memory loss and substance  abuse. The patient is not nervous/anxious and does not have insomnia.        Objective:  Blood pressure 112/71, pulse (!) 51, height 5' 1 (1.549 m), weight 151 lb (68.5 kg).   Physical Exam  Vitals reviewed. Constitutional: She is oriented to person, place, and time. She appears well-developed and well-nourished.  HENT:  Head: Normocephalic and atraumatic.        Right Ear: External ear normal.  Left Ear: External ear normal.  Nose: Nose normal.  Mouth/Throat: Oropharynx is clear  and moist.  Eyes: Conjunctivae and EOM are normal. Pupils are equal, round, and reactive to light. Right eye exhibits no discharge. Left eye exhibits no discharge. No scleral icterus.  Neck: Normal range of motion. Neck supple. No tracheal deviation present. No thyromegaly present.  Cardiovascular: Normal rate, regular rhythm, normal heart sounds and intact distal pulses.  Exam reveals no gallop and no friction rub.   No murmur heard. Respiratory: Effort normal and breath sounds normal. No respiratory distress. She has no wheezes. She has no rales. She exhibits no tenderness.  GI: Soft. Bowel sounds are normal. She exhibits no distension and no mass. There is no tenderness. There is no rebound and no guarding.  Genitourinary:  Breasts no masses skin changes or nipple changes bilaterally      Vulva is normal without lesions Vagina is pink moist without discharge Cervix normal in appearance and pap is done Uterus is normal size shape and contour Adnexa is negative with normal sized ovaries   Musculoskeletal: Normal range of motion. She exhibits no edema and no tenderness.  Neurological: She is alert and oriented to person, place, and time. She has normal reflexes. She displays normal reflexes. No cranial nerve deficit. She exhibits normal muscle tone. Coordination normal.  Skin: Skin is warm and dry. No rash noted. No erythema. No pallor.  Psychiatric: She has a normal mood and affect. Her behavior is normal.  Judgment and thought content normal.       Medications Ordered at today's visit: No orders of the defined types were placed in this encounter.   Other orders placed at today's visit: No orders of the defined types were placed in this encounter.    ASSESSMENT + PLAN:    ICD-10-CM   1. Well woman exam with routine gynecological exam  Z01.419     2. Pap smear, as part of routine gynecological examination  Z01.419           Return in about 3 years (around 06/15/2027), or if symptoms worsen or fail to improve.

## 2024-06-14 NOTE — Addendum Note (Signed)
 Addended by: ILEAN RUTHERFORD HERO on: 06/14/2024 10:19 AM   Modules accepted: Orders

## 2024-06-16 ENCOUNTER — Ambulatory Visit (HOSPITAL_COMMUNITY): Payer: Self-pay | Admitting: Obstetrics & Gynecology

## 2024-06-16 LAB — CYTOLOGY - PAP
Adequacy: ABSENT
Chlamydia: NEGATIVE
Comment: NEGATIVE
Comment: NEGATIVE
Comment: NEGATIVE
Comment: NEGATIVE
Comment: NORMAL
Diagnosis: UNDETERMINED — AB
HPV 16: NEGATIVE
HPV 18 / 45: NEGATIVE
High risk HPV: POSITIVE — AB
Neisseria Gonorrhea: NEGATIVE

## 2024-06-28 ENCOUNTER — Other Ambulatory Visit (HOSPITAL_COMMUNITY): Payer: Self-pay | Admitting: Family Medicine

## 2024-06-28 DIAGNOSIS — Z1231 Encounter for screening mammogram for malignant neoplasm of breast: Secondary | ICD-10-CM

## 2024-07-19 NOTE — Telephone Encounter (Signed)
 Please arrange colonoscopy due to rectal bleeding and diarrhea. Colonoscopy with random colonic biopsies with Dr Cindie. ASA 2.   Hold jardiance X 72 hours prior Hold mounjaro one week prior

## 2024-07-21 MED ORDER — PEG 3350-KCL-NA BICARB-NACL 420 G PO SOLR: 4000.0000 mL | Freq: Once | ORAL | 0 refills | Status: AC

## 2024-07-21 NOTE — Addendum Note (Signed)
 Addended by: JEANELL GRAEME RAMAN on: 07/21/2024 07:55 AM   Modules accepted: Orders

## 2024-07-21 NOTE — Telephone Encounter (Signed)
 CPT Code GECOL Description: Colonoscopy Authorization Number: [id number] Case Number: [id number] Review Date: 07/21/2024 7:54:36 AM Expiration Date: 10/19/2024 Status: Your case has been Approved.

## 2024-07-29 ENCOUNTER — Encounter: Payer: Self-pay | Admitting: *Deleted

## 2024-07-29 ENCOUNTER — Telehealth: Payer: Self-pay | Admitting: *Deleted

## 2024-07-29 NOTE — Telephone Encounter (Signed)
 Pt wanted to rescheduled from 08/03/24 due to incoming weather. She has been rescheduled to 08/13/24.Updated instructions sent via mychart.

## 2024-07-29 NOTE — Pre-Procedure Instructions (Signed)
 Attempted pre-op phonecall. Left VM for her to call us  back.

## 2024-07-30 ENCOUNTER — Encounter (HOSPITAL_COMMUNITY)
Admission: RE | Admit: 2024-07-30 | Discharge: 2024-07-30 | Disposition: A | Discharge status: Home / Self Care | Source: Ambulatory Visit | Attending: Internal Medicine | Admitting: Internal Medicine

## 2024-08-09 ENCOUNTER — Ambulatory Visit (HOSPITAL_COMMUNITY)

## 2024-08-09 ENCOUNTER — Encounter (HOSPITAL_COMMUNITY): Payer: Self-pay

## 2024-08-10 ENCOUNTER — Telehealth: Payer: Self-pay | Admitting: *Deleted

## 2024-08-10 NOTE — Telephone Encounter (Signed)
 Pt called back and had to reschedule again due to having a doctors appointment on 08/31/24. She has been rescheduled to 09/02/24 at 10:15 am.  Updated instructions sent via mychart.

## 2024-08-12 ENCOUNTER — Ambulatory Visit: Admitting: Gastroenterology

## 2024-08-16 ENCOUNTER — Ambulatory Visit (HOSPITAL_COMMUNITY)

## 2024-08-26 ENCOUNTER — Other Ambulatory Visit (HOSPITAL_COMMUNITY)

## 2024-09-02 ENCOUNTER — Encounter (HOSPITAL_COMMUNITY): Admission: RE | Payer: Self-pay | Source: Home / Self Care

## 2024-09-02 ENCOUNTER — Ambulatory Visit (HOSPITAL_COMMUNITY): Admission: RE | Admit: 2024-09-02 | Source: Home / Self Care | Admitting: Internal Medicine

## 2024-09-23 ENCOUNTER — Ambulatory Visit: Admitting: Gastroenterology
